# Patient Record
Sex: Female | Born: 1985 | Race: Black or African American | Hispanic: No | Marital: Married | Smoking: Never smoker
Health system: Southern US, Community
[De-identification: ages and names within clinical notes are randomized; demographics above are authoritative.]

## PROBLEM LIST (undated history)

## (undated) ENCOUNTER — Inpatient Hospital Stay (HOSPITAL_COMMUNITY): Payer: Self-pay

## (undated) DIAGNOSIS — Z789 Other specified health status: Secondary | ICD-10-CM

## (undated) DIAGNOSIS — Z3490 Encounter for supervision of normal pregnancy, unspecified, unspecified trimester: Secondary | ICD-10-CM

## (undated) HISTORY — PX: NO PAST SURGERIES: SHX2092

---

## 2011-10-14 NOTE — L&D Delivery Note (Signed)
Delivery Note At 4:02 PM a viable female, "Kelli Griffin" was delivered via Vaginal, Spontaneous Delivery (Presentation: LOP;  ).  APGAR: 8, 9; weight 6 lb 7 oz (2920 g).   Placenta status: Intact, Spontaneous.  Cord: 3 vessels with the following complications: None.  Cord pH: TBA Prolonged crowning was noted, therefore midline episiotomy was performed.  Infant was floppy at delivery, with presentation LOP.  He responded to stimulation and suctioning, and was able to be placed skin to skin with mother.  Patient's temp was 99 axillary just before delivery, then 101.3 axillary just after delivery.  Per consult with Dr. Pennie Rushing, plan observation of temp pp--no additional orders at present. Perineum was edematous prior to delivery, remains so after--no evidence of hematoma.  Anesthesia: Epidural  Episiotomy: Median Lacerations: None Suture Repair: 3.0 and 4.0 monocryl Est. Blood Loss (mL): 300 cc  Mom to postpartum.  Baby to skin to skin. FOB left just after delivery--plans to return tonight.  No information yet on circumcision plan.  Nigel Bridgeman 07/26/2012, 4:56 PM

## 2011-12-03 ENCOUNTER — Encounter (HOSPITAL_COMMUNITY): Payer: Self-pay

## 2011-12-03 ENCOUNTER — Emergency Department (HOSPITAL_COMMUNITY)
Admission: EM | Admit: 2011-12-03 | Discharge: 2011-12-03 | Disposition: A | Discharge status: Home / Self Care | Payer: Self-pay | Attending: Emergency Medicine | Admitting: Emergency Medicine

## 2011-12-03 DIAGNOSIS — O210 Mild hyperemesis gravidarum: Secondary | ICD-10-CM | POA: Insufficient documentation

## 2011-12-03 DIAGNOSIS — R630 Anorexia: Secondary | ICD-10-CM | POA: Insufficient documentation

## 2011-12-03 LAB — URINALYSIS, ROUTINE W REFLEX MICROSCOPIC
Bilirubin Urine: NEGATIVE
Ketones, ur: 15 mg/dL — AB
Leukocytes, UA: NEGATIVE
Nitrite: NEGATIVE
Specific Gravity, Urine: 1.025 (ref 1.005–1.030)
Urobilinogen, UA: 1 mg/dL (ref 0.0–1.0)
pH: 6 (ref 5.0–8.0)

## 2011-12-03 LAB — URINE MICROSCOPIC-ADD ON

## 2011-12-03 MED ORDER — SODIUM CHLORIDE 0.9 % IV BOLUS (SEPSIS)
1000.0000 mL | Freq: Once | INTRAVENOUS | Status: AC
  Administered 2011-12-03: 1000 mL via INTRAVENOUS

## 2011-12-03 MED ORDER — PRENATAL RX 60-1 MG PO TABS: 1.0000 | ORAL_TABLET | Freq: Every day | ORAL | Status: DC

## 2011-12-03 MED ORDER — ONDANSETRON HCL 4 MG PO TABS: 4.0000 mg | ORAL_TABLET | Freq: Four times a day (QID) | ORAL | Status: AC

## 2011-12-03 MED ORDER — ONDANSETRON HCL 4 MG/2ML IJ SOLN
4.0000 mg | Freq: Once | INTRAMUSCULAR | Status: AC
  Administered 2011-12-03: 4 mg via INTRAVENOUS
  Filled 2011-12-03: qty 2

## 2011-12-03 NOTE — Discharge Instructions (Signed)
Return to the ED with any concerns including vomiting and not able to keep down liquids, fever, abdominal pain, vaginal bleeding, fainting, decreased level of alertness or lethargy, or any other alarming symptoms.

## 2011-12-03 NOTE — ED Notes (Signed)
Pt presents with 2 day h/o vomiting.  Pt is pregnant, denies any abdominal pain or diarrhea.  Pt reports nausea, decreased appetite.

## 2011-12-03 NOTE — ED Provider Notes (Signed)
History     CSN: [id number]  Arrival date & time 12/03/11  1324   First MD Initiated Contact with Patient 12/03/11 1617      Chief Complaint  Patient presents with  . Emesis    (Consider location/radiation/quality/duration/timing/severity/associated sxs/prior treatment) HPI Patient presents with complaint of nausea and vomiting. She states that she vomited 3 times today. She has been able to keep down fluids but has not been able to eat solid food. She does state that she is pregnant. She has not established primary care as of yet. She denies any abdominal pain, vaginal bleeding or vaginal discharge. She's had no fainting. She denies fever. She denies dysuria urgency or frequency.  There are no other associated systemic symptoms, there are no other alleviating or modifying factors.   History reviewed. No pertinent past medical history.  History reviewed. No pertinent past surgical history.  No family history on file.  History  Substance Use Topics  . Smoking status: Never Smoker   . Smokeless tobacco: Not on file  . Alcohol Use:     OB History    Grav Para Term Preterm Abortions TAB SAB Ect Mult Living   1               Review of Systems ROS reviewed and otherwise negative except for mentioned in HPI  Allergies  Review of patient's allergies indicates no known allergies.  Home Medications   Current Outpatient Rx  Name Route Sig Dispense Refill  . ONDANSETRON HCL 4 MG PO TABS Oral Take 1 tablet (4 mg total) by mouth every 6 (six) hours. 12 tablet 0  . PRENATAL RX 60-1 MG PO TABS Oral Take 1 tablet by mouth daily. 1 tablet 0    BP 107/70  Pulse 81  Temp(Src) 98.6 F (37 C) (Oral)  Resp 16  Ht 5\' 3"  (1.6 m)  Wt 110 lb (49.896 kg)  BMI 19.49 kg/m2  SpO2 100%  LMP 11/26/2011 Vitals reviewed Physical Exam Physical Examination: General appearance - alert, well appearing, and in no distress Mental status - alert, oriented to person, place, and time Eyes-  PERRL, no scleral icterus Mouth - mucous membranes moist, pharynx normal without lesions Chest - clear to auscultation, no wheezes, rales or rhonchi, symmetric air entry Heart - normal rate, regular rhythm, normal S1, S2, no murmurs, rubs, clicks or gallops Abdomen - soft, nontender, nondistended, no masses or organomegaly, NABS Extremities - peripheral pulses normal, no pedal edema, no clubbing or cyanosis Skin - normal coloration and turgor, no rashes  ED Course  Procedures (including critical care time)  Labs Reviewed  URINALYSIS, ROUTINE W REFLEX MICROSCOPIC - Abnormal; Notable for the following:    Hgb urine dipstick TRACE (*)    Ketones, ur 15 (*)    All other components within normal limits  POCT PREGNANCY, URINE - Abnormal; Notable for the following:    Preg Test, Ur POSITIVE (*)    All other components within normal limits  URINE MICROSCOPIC-ADD ON - Abnormal; Notable for the following:    Squamous Epithelial / LPF MANY (*)    All other components within normal limits  LAB REPORT - SCANNED   No results found.   1. Hyperemesis gravidarum       MDM  Patient presenting in early pregnancy with nausea and vomiting. Her abdomen is nontender. She denies any vaginal bleeding or vaginal discharge. She feels improved after IV hydration as well as antibiotics. Her urinalysis was reassuring. She is  nontoxic and well-hydrated in appearance. She and her husband are requesting discharge at this time. She will be given OB referral information and started on prenatal vitamins as well as discharge with prescription for antibiotics. She was given strict return precautions and is agreeable with this plan.        Ethelda Chick, MD 12/06/11 [phone]

## 2011-12-18 ENCOUNTER — Inpatient Hospital Stay (HOSPITAL_COMMUNITY)
Admission: AD | Admit: 2011-12-18 | Discharge: 2011-12-18 | Disposition: A | Discharge status: Home / Self Care | Payer: Self-pay | Source: Ambulatory Visit | Attending: Obstetrics & Gynecology | Admitting: Obstetrics & Gynecology

## 2011-12-18 ENCOUNTER — Encounter (HOSPITAL_COMMUNITY): Payer: Self-pay

## 2011-12-18 DIAGNOSIS — O210 Mild hyperemesis gravidarum: Secondary | ICD-10-CM | POA: Insufficient documentation

## 2011-12-18 DIAGNOSIS — O219 Vomiting of pregnancy, unspecified: Secondary | ICD-10-CM

## 2011-12-18 HISTORY — DX: Other specified health status: Z78.9

## 2011-12-18 LAB — COMPREHENSIVE METABOLIC PANEL
ALT: 8 U/L (ref 0–35)
AST: 15 U/L (ref 0–37)
Alkaline Phosphatase: 34 U/L — ABNORMAL LOW (ref 39–117)
CO2: 24 mEq/L (ref 19–32)
Chloride: 99 mEq/L (ref 96–112)
GFR calc Af Amer: >90 mL/min (ref >90)
GFR calc non Af Amer: >90 mL/min (ref >90)
Glucose, Bld: 96 mg/dL (ref 70–99)
Sodium: 130 mEq/L — ABNORMAL LOW (ref 135–145)
Total Bilirubin: 0.2 mg/dL — ABNORMAL LOW (ref 0.3–1.2)

## 2011-12-18 LAB — URINALYSIS, ROUTINE W REFLEX MICROSCOPIC
Glucose, UA: NEGATIVE mg/dL
Hgb urine dipstick: NEGATIVE
Leukocytes, UA: NEGATIVE
pH: 6.5 (ref 5.0–8.0)

## 2011-12-18 LAB — CBC
MCH: 21.2 pg — ABNORMAL LOW (ref 26.0–34.0)
MCHC: 31.4 g/dL (ref 30.0–36.0)
Platelets: 262 10*3/uL (ref 150–400)
RDW: 14.9 % (ref 11.5–15.5)

## 2011-12-18 MED ORDER — PROMETHAZINE HCL 25 MG PO TABS: 12.5000 mg | ORAL_TABLET | Freq: Four times a day (QID) | ORAL | Status: DC | PRN

## 2011-12-18 MED ORDER — PROMETHAZINE HCL 12.5 MG PO TABS: 12.5000 mg | ORAL_TABLET | Freq: Four times a day (QID) | ORAL | Status: DC | PRN

## 2011-12-18 MED ORDER — FAMOTIDINE 20 MG PO TABS
20.0000 mg | ORAL_TABLET | ORAL | Status: AC
  Administered 2011-12-18: 20 mg via ORAL
  Filled 2011-12-18: qty 1

## 2011-12-18 MED ORDER — LACTATED RINGERS IV BOLUS (SEPSIS): 1000.0000 mL | Freq: Once | INTRAVENOUS | Status: DC

## 2011-12-18 MED ORDER — PROMETHAZINE HCL 25 MG PO TABS
12.5000 mg | ORAL_TABLET | ORAL | Status: AC
  Administered 2011-12-18: 12.5 mg via ORAL
  Filled 2011-12-18: qty 1

## 2011-12-18 MED ORDER — LACTATED RINGERS IV BOLUS (SEPSIS)
1000.0000 mL | INTRAVENOUS | Status: AC
  Administered 2011-12-18: 1000 mL via INTRAVENOUS

## 2011-12-18 NOTE — Progress Notes (Signed)
Patient states she has had vomiting since she was seen at [facility] ED on 2-20. Has no appetite and upper abdominal pain with vomiting. No bleeding or discharge.

## 2011-12-18 NOTE — ED Provider Notes (Signed)
History     Chief Complaint  Patient presents with  . Emesis During Pregnancy   HPI Pt presents with vomiting x3 today.  She reports she has not been able to keep food down.  She has not taken the medication prescribed for nausea by the ED at [facility].  She reports abdominal pain indicated as epigastric, and denies lower abdominal pain, cramping, vaginal bleeding, dizziness, or fever/chills.    Patient's last menstrual period was 11/26/2011.   OB History    Grav Para Term Preterm Abortions TAB SAB Ect Mult Living   1 0 0 0 0 0 0 0 0 0       Past Medical History  Diagnosis Date  . No pertinent past medical history     Past Surgical History  Procedure Date  . No past surgeries     No family history on file.  History  Substance Use Topics  . Smoking status: Never Smoker   . Smokeless tobacco: Not on file  . Alcohol Use:     Allergies: No Known Allergies  Prescriptions prior to admission  Medication Sig Dispense Refill  . Prenatal Vit-Fe Fumarate-FA (PRENATAL MULTIVITAMIN) TABS Take 1 tablet by mouth daily.        Review of Systems  Constitutional: Negative for fever, chills and malaise/fatigue.  Eyes: Negative for blurred vision.  Respiratory: Negative for cough and shortness of breath.   Cardiovascular: Negative for chest pain.  Gastrointestinal: Positive for heartburn, nausea and vomiting. Negative for diarrhea and constipation.  Genitourinary: Negative for dysuria, urgency and frequency.  Musculoskeletal: Negative.   Neurological: Negative for dizziness and headaches.  Psychiatric/Behavioral: Negative for depression.   Physical Exam   Blood pressure 109/70, pulse 91, temperature 98.7 F (37.1 C), temperature source Oral, resp. rate 16, height 5' 0.75" (1.543 m), weight 52.799 kg (116 lb 6.4 oz), last menstrual period 11/26/2011, SpO2 100.00%.  Physical Exam  Nursing note and vitals reviewed. Constitutional: She is oriented to person, place, and time. She  appears well-developed and well-nourished.  Neck: Normal range of motion.  Cardiovascular: Normal rate.   Respiratory: Effort normal.  GI: Soft. There is no tenderness.  Musculoskeletal: Normal range of motion.  Neurological: She is alert and oriented to person, place, and time.  Skin: Skin is warm and dry.  Psychiatric: She has a normal mood and affect. Her behavior is normal. Judgment and thought content normal.   Results for orders placed during the hospital encounter of 12/18/11 (from the past 24 hour(s))  URINALYSIS, ROUTINE W REFLEX MICROSCOPIC     Status: Abnormal   Collection Time   12/18/11  1:30 PM      Component Value Range   Color, Urine YELLOW  YELLOW    APPearance HAZY (*) CLEAR    Specific Gravity, Urine 1.020  1.005 - 1.030    pH 6.5  5.0 - 8.0    Glucose, UA NEGATIVE  NEGATIVE (mg/dL)   Hgb urine dipstick NEGATIVE  NEGATIVE    Bilirubin Urine NEGATIVE  NEGATIVE    Ketones, ur >80 (*) NEGATIVE (mg/dL)   Protein, ur NEGATIVE  NEGATIVE (mg/dL)   Urobilinogen, UA 0.2  0.0 - 1.0 (mg/dL)   Nitrite NEGATIVE  NEGATIVE    Leukocytes, UA NEGATIVE  NEGATIVE   COMPREHENSIVE METABOLIC PANEL     Status: Abnormal   Collection Time   12/18/11  2:10 PM      Component Value Range   Sodium 130 (*) 135 - 145 (mEq/L)  Potassium 3.5  3.5 - 5.1 (mEq/L)   Chloride 99  96 - 112 (mEq/L)   CO2 24  19 - 32 (mEq/L)   Glucose, Bld 96  70 - 99 (mg/dL)   BUN 7  6 - 23 (mg/dL)   Creatinine, Ser 4.09  0.50 - 1.10 (mg/dL)   Calcium 81.1  8.4 - 10.5 (mg/dL)   Total Protein 7.4  6.0 - 8.3 (g/dL)   Albumin 3.7  3.5 - 5.2 (g/dL)   AST 15  0 - 37 (U/L)   ALT 8  0 - 35 (U/L)   Alkaline Phosphatase 34 (*) 39 - 117 (U/L)   Total Bilirubin 0.2 (*) 0.3 - 1.2 (mg/dL)   GFR calc non Af Amer >90  >90 (mL/min)   GFR calc Af Amer >90  >90 (mL/min)  CBC     Status: Abnormal   Collection Time   12/18/11  2:10 PM      Component Value Range   WBC 7.1  4.0 - 10.5 (K/uL)   RBC 4.95  3.87 - 5.11 (MIL/uL)    Hemoglobin 10.5 (*) 12.0 - 15.0 (g/dL)   HCT 91.4 (*) 78.2 - 46.0 (%)   MCV 67.5 (*) 78.0 - 100.0 (fL)   MCH 21.2 (*) 26.0 - 34.0 (pg)   MCHC 31.4  30.0 - 36.0 (g/dL)   RDW 95.6  21.3 - 08.6 (%)   Platelets 262  150 - 400 (K/uL)   MAU Course  Procedures U/a, CBC, CMP  MDM Phenergan and Pepcid PO given in MAU, IV LR  Assessment and Plan  A: N/V of pregnancy  P: D/C home Return to MAU as needed if worsening n/v, vaginal bleeding, lower abdominal pain, fever/chills  Medication List  As of 12/18/2011  8:50 PM   TAKE these medications         prenatal multivitamin Tabs   Take 1 tablet by mouth daily.      promethazine 12.5 MG tablet   Commonly known as: PHENERGAN   Take 1 tablet (12.5 mg total) by mouth every 6 (six) hours as needed for nausea.           LEFTWICH-KIRBY, Keirston Saephanh 12/18/2011, 2:41 PM

## 2011-12-18 NOTE — Discharge Instructions (Signed)
Morning Sickness Morning sickness is when you feel sick to your stomach (nauseous) during pregnancy. This nauseous feeling may or may not come with throwing up (vomiting). It often occurs in the morning, but can be a problem any time of day. While morning sickness is unpleasant, it is usually harmless unless you develop severe and continual vomiting (hyperemesis gravidarum). This condition requires more intense treatment. CAUSES  The cause of morning sickness is not completely known but seems to be related to a sudden increase of two hormones:   Human chorionic gonadotropin (hCG).   Estrogen hormone.  These are elevated in the first part of the pregnancy. TREATMENT  Do not use any medicines (prescription, over-the-counter, or herbal) for morning sickness without first talking to your caregiver. Some patients are helped by the following:  Vitamin B6 (25mg  every 8 hours) or vitamin B6 shots.   An antihistamine called doxylamine (10mg  every 8 hours).   The herbal medication ginger.  HOME CARE INSTRUCTIONS   Taking multivitamins before getting pregnant can prevent or decrease the severity of morning sickness in most women.   Eat a piece of dry toast or unsalted crackers before getting out of bed in the morning.   Eat 5 or 6 small meals a day.   Eat dry and bland foods (rice, baked potato).   Do not drink liquids with your meals. Drink liquids between meals.   Avoid greasy, fatty, and spicy foods.   Get someone to cook for you if the smell of any food causes nausea and vomiting.   Avoid vitamin pills with iron because iron can cause nausea.   Snack on protein foods between meals if you are hungry.   Eat unsweetened gelatins for deserts.   Wear an acupressure wristband (worn for sea sickness) may be helpful.   Acupuncture may be helpful.   Do not smoke.   Get a humidifier to keep the air in your house free of odors.  SEEK MEDICAL CARE IF:   Your home remedies are not working  and you need medication.   You feel dizzy or lightheaded.   You are losing weight.   You need help with your diet.  SEEK IMMEDIATE MEDICAL CARE IF:   You have persistent and uncontrolled nausea and vomiting.   You pass out (faint).   You have a fever.  MAKE SURE YOU:   Understand these instructions.   Will watch your condition.   Will get help right away if you are not doing well or get worse.  Document Released: 11/20/2006 Document Revised: 09/18/2011 Document Reviewed: 09/17/2007 [facility] Patient Information 2012 [place], [place].  Pregnancy, First Trimester The first trimester is the first 3 months your baby is growing inside you. It is important to follow your doctor's instructions. HOME CARE   Do not smoke.   Do not drink alcohol.   Only take medicine as told by your doctor.   Exercise.   Eat healthy foods. Eat regular, well-balanced meals.   You can have sex (intercourse) if there are no other problems with the pregnancy.   Things that help with morning sickness:   Eat soda crackers before getting up in the morning.   Eat 4 to 5 small meals rather than 3 large meals.   Drink liquids between meals, not during meals.   Go to all appointments as told.   Take all vitamins or supplements as told by your doctor.  GET HELP RIGHT AWAY IF:   You develop a fever.  You have a bad smelling fluid that is leaking from your vagina.   There is bleeding from the vagina.   You develop severe belly (abdominal) or back pain.   You throw up (vomit) blood. It may look like coffee grounds.   You lose more than 2 pounds in a week.   You gain 5 pounds or more in a week.   You gain more than 2 pounds in a week and you see puffiness (swelling) in your feet, ankles, or legs.   You have severe dizziness or pass out (faint).   You are around people who have [place] measles, chickenpox, or fifth disease.   You have a headache, watery poop (diarrhea), pain with peeing  (urinating), or cannot breath right.  Document Released: 03/17/2008 Document Revised: 09/18/2011 Document Reviewed: 03/17/2008 [facility] Patient Information 2012 [place], [place].

## 2012-02-21 ENCOUNTER — Encounter (HOSPITAL_COMMUNITY): Payer: Self-pay | Admitting: *Deleted

## 2012-02-21 ENCOUNTER — Emergency Department (HOSPITAL_COMMUNITY)
Admission: EM | Admit: 2012-02-21 | Discharge: 2012-02-22 | Disposition: A | Discharge status: Home / Self Care | Payer: Medicaid Other | Attending: Emergency Medicine | Admitting: Emergency Medicine

## 2012-02-21 DIAGNOSIS — O99891 Other specified diseases and conditions complicating pregnancy: Secondary | ICD-10-CM | POA: Insufficient documentation

## 2012-02-21 DIAGNOSIS — R51 Headache: Secondary | ICD-10-CM | POA: Insufficient documentation

## 2012-02-21 DIAGNOSIS — O26899 Other specified pregnancy related conditions, unspecified trimester: Secondary | ICD-10-CM

## 2012-02-21 DIAGNOSIS — R112 Nausea with vomiting, unspecified: Secondary | ICD-10-CM | POA: Insufficient documentation

## 2012-02-21 HISTORY — DX: Encounter for supervision of normal pregnancy, unspecified, unspecified trimester: Z34.90

## 2012-02-21 LAB — URINALYSIS, ROUTINE W REFLEX MICROSCOPIC
Glucose, UA: NEGATIVE mg/dL
Ketones, ur: NEGATIVE mg/dL
Nitrite: NEGATIVE
Protein, ur: NEGATIVE mg/dL
Urobilinogen, UA: 0.2 mg/dL (ref 0.0–1.0)

## 2012-02-21 LAB — URINE MICROSCOPIC-ADD ON

## 2012-02-21 MED ORDER — OXYCODONE-ACETAMINOPHEN 5-325 MG PO TABS
1.0000 | ORAL_TABLET | Freq: Once | ORAL | Status: AC
  Administered 2012-02-21: 1 via ORAL
  Filled 2012-02-21: qty 1

## 2012-02-21 NOTE — ED Provider Notes (Signed)
History     CSN: [id number]  Arrival date & time 02/21/12  2028   First MD Initiated Contact with Patient 02/21/12 2138      Chief Complaint  Patient presents with  . Headache    (Consider location/radiation/quality/duration/timing/severity/associated sxs/prior treatment) HPI History provided by pt and significant other who is translating.  Per significant other, pt is 4 months pregnant.  Has not received any prenatal care because uninsured.  She has been experiencing an intermittent headache since yesterday morning.  Pt reports that pain located across forehead.  No associated fever, dizziness, blurred vision.  Has N/V but this is chronic since she became pregnant.  Denies nasal congestion, rhinorrhea and sore throat.  Has never had a headache like this before.  Denies head trauma.  Denies abd pain, vaginal bleeding and vaginal discharge.  Past Medical History  Diagnosis Date  . No pertinent past medical history   . Pregnant     Past Surgical History  Procedure Date  . No past surgeries     History reviewed. No pertinent family history.  History  Substance Use Topics  . Smoking status: Never Smoker   . Smokeless tobacco: Not on file  . Alcohol Use: Yes    OB History    Grav Para Term Preterm Abortions TAB SAB Ect Mult Living   1 0 0 0 0 0 0 0 0 0       Review of Systems  All other systems reviewed and are negative.    Allergies  Review of patient's allergies indicates no known allergies.  Home Medications   Current Outpatient Rx  Name Route Sig Dispense Refill  . PRENATAL MULTIVITAMIN CH Oral Take 1 tablet by mouth daily.      BP 108/68  Pulse 90  Temp(Src) 99.4 F (37.4 C) (Oral)  Resp 16  SpO2 100%  LMP 11/26/2011  Physical Exam  Nursing note and vitals reviewed. Constitutional: She is oriented to person, place, and time. She appears well-developed and well-nourished. No distress.       Pt does not appear uncomfortable.  HENT:  Head:  Normocephalic and atraumatic.       No sinus ttp  Eyes:       Normal appearance  Neck: Normal range of motion.       No meningeal signs  Cardiovascular: Normal rate, regular rhythm and intact distal pulses.   Pulmonary/Chest: Effort normal and breath sounds normal.  Musculoskeletal: Normal range of motion.  Neurological: She is alert and oriented to person, place, and time. No sensory deficit. Coordination normal.       CN 3-12 intact.  No nystagmus. 5/5 and equal upper and lower extremity strength.  No past pointing.     Skin: Skin is warm and dry. No rash noted.  Psychiatric: She has a normal mood and affect. Her behavior is normal.    ED Course  Procedures (including critical care time)  Labs Reviewed  URINALYSIS, ROUTINE W REFLEX MICROSCOPIC - Abnormal; Notable for the following:    Leukocytes, UA SMALL (*)    All other components within normal limits  POCT PREGNANCY, URINE - Abnormal; Notable for the following:    Preg Test, Ur POSITIVE (*)    All other components within normal limits  URINE MICROSCOPIC-ADD ON - Abnormal; Notable for the following:    Squamous Epithelial / LPF MANY (*)    Bacteria, UA FEW (*)    All other components within normal limits   No results found.  1. Headache in pregnancy, antepartum       MDM  Healthy 26yo F who is approx 4 months pregnant, no prenatal care, presents w/ non-traumatic frontal headache.  On exam, pt well-appearing, afebrile, no focal neuro deficits or meningeal signs.  No HTN or proteinuria to suggest preeclampsia.  Pt received one po percocet and headache resolved.  Pt d/c'd home.  Recommended tylenol for pain and f/u with [facility] to initiate prenatal care asap.  Return precautions discussed.          Otilio Miu, [place] 02/22/12 1223

## 2012-02-21 NOTE — ED Notes (Addendum)
Pt given ice pack for pain and encouraged to give urine.

## 2012-02-21 NOTE — ED Notes (Signed)
Patient with headache that started yesterday.  States that it seem to happen at night and tonight her headache is worse.  Patient has had headaches in the past but not like today.  Patient is 4 months pregnant

## 2012-02-21 NOTE — ED Notes (Signed)
Pt c/o headache since yesterday.  Denies hx of migraines.  Denies photophobia or changes in vision.  Pt is 4 months pregnant.

## 2012-02-22 NOTE — ED Provider Notes (Signed)
Medical screening examination/treatment/procedure(s) were performed by non-physician practitioner and as supervising physician I was immediately available for consultation/collaboration.   Carleene Cooper III, MD 02/22/12 [phone]

## 2012-02-22 NOTE — Discharge Instructions (Signed)
Take tylenol as needed for headache pain.  Avoid medications like ibuprofen, advil, motrin and aleve because these could be harmful to the baby.  Continue your prenatal vitamin.  Follow up with [facility] [place] [phone]; [address]) as soon as possible.  You should return to the ER if your headache pain worsens or you develop associated fever.

## 2012-04-09 ENCOUNTER — Inpatient Hospital Stay (HOSPITAL_COMMUNITY)
Admission: AD | Admit: 2012-04-09 | Discharge: 2012-04-09 | Disposition: A | Discharge status: Home / Self Care | Payer: Medicaid Other | Source: Ambulatory Visit | Attending: Obstetrics & Gynecology | Admitting: Obstetrics & Gynecology

## 2012-04-09 ENCOUNTER — Encounter (HOSPITAL_COMMUNITY): Payer: Self-pay

## 2012-04-09 DIAGNOSIS — O0930 Supervision of pregnancy with insufficient antenatal care, unspecified trimester: Secondary | ICD-10-CM | POA: Insufficient documentation

## 2012-04-09 DIAGNOSIS — O210 Mild hyperemesis gravidarum: Secondary | ICD-10-CM | POA: Insufficient documentation

## 2012-04-09 NOTE — Discharge Instructions (Signed)
Prenatal Care Providers °[facility]    [facility] ° Phone- [phone]     Phone: [phone] °         °[facility]                      [facility] ° @[place]     Phone: [phone] ° Phone: [phone] °        [facility] °[facility]     Phone: [phone] ° Phone: [phone]   °        [facility] °[facility] [place]                hone: [phone] ° Phone: [phone] °        [facility] °Dr. Bernard Marshall      Phone: [phone] ° Phone: [phone] °        [facility] °[facility]                Phone: [phone] ° Women’s Health  ° Phone:[phone]    [facility] °         Phone: [phone] °[facility] °  Phone: [phone] °

## 2012-04-09 NOTE — MAU Provider Note (Signed)
Chief Complaint:  No chief complaint on file.   First Provider Initiated Contact with Patient 04/09/12 1002      HPI  Kelli Griffin is a 26 y.o. G1P0000 at [redacted]w[redacted]d presents wanting to start prenatal care.  Pregnancy problems other than visits in the MA U. For nausea and vomiting of pregnancy which has resolved. Denies contractions, leakage of fluid or vaginal bleeding. Good fetal movement.  Recently moved here from [place]. Has Medicaid.  Pregnancy Course: uncomplicated, NPC  Past Medical History: Past Medical History  Diagnosis Date  . No pertinent past medical history   . Pregnant     Past Surgical History: Past Surgical History  Procedure Date  . No past surgeries     Family History: Family History  Problem Relation Age of Onset  . Other Neg Hx     Social History: History  Substance Use Topics  . Smoking status: Never Smoker   . Smokeless tobacco: Not on file  . Alcohol Use: Yes    Allergies: No Known Allergies  Meds:  Prescriptions prior to admission  Medication Sig Dispense Refill  . Prenatal Vit-Fe Fumarate-FA (PRENATAL MULTIVITAMIN) TABS Take 1 tablet by mouth daily.          Physical Exam  Blood pressure 100/65, pulse 85, temperature 98.6 F (37 C), temperature source Oral, resp. rate 16, height 5\' 1"  (1.549 m), weight 57.516 kg (126 lb 12.8 oz), last menstrual period 11/26/2011, SpO2 100.00%. GENERAL: Well-developed, well-nourished female in no acute distress.  HEENT: normocephalic, good dentition HEART: normal rate RESP: normal effort ABDOMEN: Soft, nontender, gravid appropriate for gestational age FH=21 cm; FHR 152 EXTREMITIES: Nontender, no edema NEURO: alert and oriented    Assessment:  G1 @ [redacted]w[redacted]d Normal first pregnnacy Insufficient prenatal care  Plan:  Continue prenatal vitamins, pregnancy precautions, list of providers reviewed Advices our care since possible with provider of choice   Kailo Kosik 6/28/201310:03 AM

## 2012-04-09 NOTE — MAU Note (Signed)
Patient has had no prenatal care since coming to MAU in early pregnancy for nausea and vomiting. States she is not having any problems but wants to have the baby checked. Denies any pain, bleeding or leaking and has felt some movement.

## 2012-04-13 NOTE — MAU Provider Note (Signed)
Medical Screening exam and patient care preformed by advanced practice provider.  Agree with the above management.  

## 2012-04-30 ENCOUNTER — Ambulatory Visit (INDEPENDENT_AMBULATORY_CARE_PROVIDER_SITE_OTHER): Payer: Medicaid Other

## 2012-04-30 ENCOUNTER — Encounter: Payer: Self-pay | Admitting: Obstetrics and Gynecology

## 2012-04-30 ENCOUNTER — Ambulatory Visit (INDEPENDENT_AMBULATORY_CARE_PROVIDER_SITE_OTHER): Payer: Medicaid Other | Admitting: Obstetrics and Gynecology

## 2012-04-30 ENCOUNTER — Ambulatory Visit: Payer: Medicaid Other

## 2012-04-30 VITALS — BP 100/62 | Wt 130.0 lb

## 2012-04-30 DIAGNOSIS — Z3689 Encounter for other specified antenatal screening: Secondary | ICD-10-CM

## 2012-04-30 DIAGNOSIS — Z331 Pregnant state, incidental: Secondary | ICD-10-CM

## 2012-04-30 DIAGNOSIS — O0930 Supervision of pregnancy with insufficient antenatal care, unspecified trimester: Secondary | ICD-10-CM

## 2012-04-30 LAB — US OB COMP + 14 WK

## 2012-04-30 LAB — POCT URINALYSIS DIPSTICK
Glucose, UA: NEGATIVE
Ketones, UA: NEGATIVE
Spec Grav, UA: 1.015
Urobilinogen, UA: NEGATIVE

## 2012-04-30 NOTE — Progress Notes (Signed)
[redacted]w[redacted]d USS to day f/u USS anatomy LMP stated: 11/26/11 which gives GA [redacted]w[redacted]d USS gives date of GA [redacted]w[redacted]d  Possible need to amend dating as date variance over 5 weeks. Anatomy USS: Vx presentation placenta posterior Fluid normal, Anatomy normal, Normal ovaries, no fluidin CDS, Normal [place] ROB in 4 weeks

## 2012-04-30 NOTE — Progress Notes (Unsigned)
UNKNOWN PREGRAVID WT.  PER DD PT TO HAVE ANATOMY U/S TODAY AND SHE WILL REVIEW.

## 2012-04-30 NOTE — Progress Notes (Signed)
No complaints per pt  NOB scheduled 05/14/12 with [facility]

## 2012-05-01 LAB — PRENATAL PANEL VII
Eosinophils Absolute: 0.5 10*3/uL (ref 0.0–0.7)
HCT: 30.4 % — ABNORMAL LOW (ref 36.0–46.0)
HIV: NONREACTIVE
Hemoglobin: 9.8 g/dL — ABNORMAL LOW (ref 12.0–15.0)
Hepatitis B Surface Ag: NEGATIVE
Lymphs Abs: 2.4 10*3/uL (ref 0.7–4.0)
MCH: 22.5 pg — ABNORMAL LOW (ref 26.0–34.0)
Monocytes Relative: 5 % (ref 3–12)
Neutro Abs: 5.1 10*3/uL (ref 1.7–7.7)
Neutrophils Relative %: 61 % (ref 43–77)
RBC: 4.36 MIL/uL (ref 3.87–5.11)
Rubella: 102.7 IU/mL — ABNORMAL HIGH

## 2012-05-02 LAB — CULTURE, OB URINE: Colony Count: NO GROWTH

## 2012-05-03 ENCOUNTER — Telehealth: Payer: Self-pay | Admitting: Obstetrics and Gynecology

## 2012-05-03 ENCOUNTER — Encounter: Payer: Self-pay | Admitting: Obstetrics and Gynecology

## 2012-05-03 LAB — VARICELLA ZOSTER ANTIBODY, IGG: Varicella IgG: 2.79 {ISR} — ABNORMAL HIGH

## 2012-05-03 NOTE — Telephone Encounter (Signed)
Message copied by Mason Jim on Mon May 03, 2012  9:27 AM ------      Message from: [facility]      Created: Sat May 01, 2012 12:15 PM       Needs iron 325 mg po daily #30 refill x 5      pnv daily discuss diet for anemia

## 2012-05-03 NOTE — Telephone Encounter (Signed)
TC to pt.  Spoke with husband.   Per [facility] informed to take Fe 325 mg/day and to continue PNV.  Also discussed iron rich diet.  Husband verbalized comprehension.

## 2012-05-04 LAB — VARICELLA ZOSTER ANTIBODY, IGM: Varicella Zoster Ab IgM: 0.04 (ref <0.91)

## 2012-05-04 LAB — AFP, QUAD SCREEN
Curr Gest Age: 22.2 wks.days
MoM for AFP: 2.65
Trisomy 18 (Edward) Syndrome Interp.: 1:98600 {titer}
uE3 Value: 4.9 ng/mL

## 2012-05-04 LAB — HEMOGLOBINOPATHY EVALUATION: Hemoglobin Other: 0 %

## 2012-05-07 ENCOUNTER — Telehealth: Payer: Self-pay | Admitting: Obstetrics and Gynecology

## 2012-05-07 NOTE — Telephone Encounter (Signed)
TC to pt's husband.   Has already been instructed and is taking Fe.

## 2012-05-11 ENCOUNTER — Encounter (HOSPITAL_COMMUNITY): Payer: Self-pay | Admitting: *Deleted

## 2012-05-11 ENCOUNTER — Inpatient Hospital Stay (HOSPITAL_COMMUNITY)
Admission: AD | Admit: 2012-05-11 | Discharge: 2012-05-11 | Disposition: A | Discharge status: Home / Self Care | Payer: Medicaid Other | Source: Ambulatory Visit | Attending: Obstetrics and Gynecology | Admitting: Obstetrics and Gynecology

## 2012-05-11 DIAGNOSIS — N644 Mastodynia: Secondary | ICD-10-CM

## 2012-05-11 DIAGNOSIS — O99891 Other specified diseases and conditions complicating pregnancy: Secondary | ICD-10-CM | POA: Insufficient documentation

## 2012-05-11 DIAGNOSIS — Z789 Other specified health status: Secondary | ICD-10-CM

## 2012-05-11 DIAGNOSIS — Z331 Pregnant state, incidental: Secondary | ICD-10-CM

## 2012-05-11 DIAGNOSIS — O0930 Supervision of pregnancy with insufficient antenatal care, unspecified trimester: Secondary | ICD-10-CM | POA: Insufficient documentation

## 2012-05-11 HISTORY — DX: Other specified health status: Z78.9

## 2012-05-11 MED ORDER — IBUPROFEN 200 MG PO TABS: 600.0000 mg | ORAL_TABLET | Freq: Four times a day (QID) | ORAL | Status: AC | PRN

## 2012-05-11 NOTE — MAU Note (Signed)
WITH INTERPRETER-ON PHONE-  FRENCH-  [phone]-    PT SAYS SHE IS FEELING THROBBING PAIN INSIDE L BREAST.  NO PAIN ON R BREAST.    THIS PAIN STARTED  AT 10AM.   NO DRAINAGE FROM NIPPLES.   NO MEDS FOR PAIN.       GETS [facility] AT [facility]- DID NOT CALL THEM TONIGHT .  LAST SEEN IN OFFICE -  8-23.  NEXT APPOINTMENT-   IS 8-2. DENIES INJURY TO BREAST.  IN TRIAGE - PT IS HOLDING BREAST.

## 2012-05-11 NOTE — MAU Provider Note (Signed)
  History   Kelli Griffin is a 25y.o. African female who presents unannounced at [redacted]w[redacted]d with CC of Lt breast pain since around 1000 this morning.  No prior hx of breast pain.  Describes pain as "throbbing" and at times, "stinging."  No d/c or Leakage from nipple.  No bumps, masses, lesions or sores.  Doesn't feel clothing or undergarments have rubbed tissue or are too tight.  Pt doesn't work.  Denies any strenuous activities that could have strained chest muscles.  Accompanied by her s.o.   Pt only speaks [place]; she has newly moved to [place]. From [place].  Her s.o. Has lived here for "9 yrs."  Pt's closest family is a cousin in [place].  Pt denies any fever, chills, or other illness.  GFM.  Expecting a boy.  No VB or LOF; no resp or GI c/o's or UTI s/s.  Pain has been consistent throughout the day; she has tried no relief measures prior to coming to MAU.   .. Patient Active Problem List  Diagnosis  . Late prenatal care  . Language Barrier    CSN: [id number]  Arrival date and time: 05/11/12 2126   First Provider Initiated Contact with Patient 05/11/12 2335      Chief Complaint  Patient presents with  . Breast Pain   HPI  OB History    Grav Para Term Preterm Abortions TAB SAB Ect Mult Living   1 0 0 0 0 0 0 0 0 0       Past Medical History  Diagnosis Date  . No pertinent past medical history   . Pregnant     Past Surgical History  Procedure Date  . No past surgeries     Family History  Problem Relation Age of Onset  . Other Neg Hx     History  Substance Use Topics  . Smoking status: Never Smoker   . Smokeless tobacco: Never Used  . Alcohol Use: No    Allergies: No Known Allergies  No prescriptions prior to admission    ROS---see HPI Physical Exam   Blood pressure 106/67, pulse 88, temperature 98.6 F (37 C), temperature source Oral, resp. rate 18, height 5\' 1"  (1.549 m), weight 130 lb 8 oz (59.194 kg), last menstrual period 12/03/2011.  Physical Exam  Constitutional:  She is oriented to person, place, and time. She appears well-developed and well-nourished. No distress.  HENT:  Head: Normocephalic.  Eyes: Pupils are equal, round, and reactive to light.  Cardiovascular: Normal rate.   Respiratory: Effort normal.  GI: Soft.       gravid  Genitourinary:       Pelvic deferred  Neurological: She is alert and oriented to person, place, and time.  Skin: Skin is warm and dry.  Breasts:  Bilateral breast WNL; no lesions, masses, or discolorations.  No erythema.  Bilateral nipples evert.    MAU Course  Procedures 1.  Physical exam 2. Language line  Assessment and Plan  1.  [redacted]w[redacted]d 2.  Lt breast pain likely r/t hyperplasia of pregnancy 3.  No s/s of abscess or infection 4.  Lang barrier 5.  Late to care  1.  D/c'd home after exam to f/u 05/14/12, or prn worsening s/s, fever, chills, boils/bumps, or other concerns 2.  Rec'd pt try warm compresses for comfort, and Motrin 600mg  po q6hr prn x2d 3.  U/s prn if worsening s/s or concern  Niall Illes H 05/11/2012, 11:55 PM

## 2012-05-12 ENCOUNTER — Encounter (HOSPITAL_COMMUNITY): Payer: Self-pay

## 2012-05-12 DIAGNOSIS — Z789 Other specified health status: Secondary | ICD-10-CM

## 2012-05-12 DIAGNOSIS — Z758 Other problems related to medical facilities and other health care: Secondary | ICD-10-CM

## 2012-05-12 DIAGNOSIS — Z603 Acculturation difficulty: Secondary | ICD-10-CM

## 2012-05-12 HISTORY — DX: Other specified health status: Z78.9

## 2012-05-12 HISTORY — DX: Acculturation difficulty: Z60.3

## 2012-05-14 ENCOUNTER — Ambulatory Visit (INDEPENDENT_AMBULATORY_CARE_PROVIDER_SITE_OTHER): Payer: Medicaid Other | Admitting: Obstetrics and Gynecology

## 2012-05-14 ENCOUNTER — Encounter: Payer: Self-pay | Admitting: Obstetrics and Gynecology

## 2012-05-14 VITALS — BP 88/60 | Wt 131.0 lb

## 2012-05-14 DIAGNOSIS — Z124 Encounter for screening for malignant neoplasm of cervix: Secondary | ICD-10-CM

## 2012-05-14 DIAGNOSIS — IMO0001 Reserved for inherently not codable concepts without codable children: Secondary | ICD-10-CM

## 2012-05-14 DIAGNOSIS — Z331 Pregnant state, incidental: Secondary | ICD-10-CM

## 2012-05-14 NOTE — Progress Notes (Signed)
EDD discrepancy 1 gtt given today without difficulty  FOB translates states pt has never had pap smear

## 2012-05-15 LAB — RPR

## 2012-05-16 ENCOUNTER — Encounter: Payer: Self-pay | Admitting: Obstetrics and Gynecology

## 2012-05-16 NOTE — Progress Notes (Signed)
Subjective:    Kelli Griffin is being seen today for her first obstetrical visit at [redacted]w[redacted]d gestation by [place] at 27 weeks.   Seen 7/19 at [facility] for [place], with EGA by LMP as 21 weeks, but 27 weeks by [place] that day, normal anatomy. Patient is from [place], [place], and has been in the [place] only a few months.  Husband is with her and speaks [place]. Denies any issues or concerns.  No previous USs have been done in pregnancy.  Had not received any prenatal care in [place] prior to coming to [place].  She denies any bleeding or cramping.  Her obstetrical history is significant for: Patient Active Problem List  Diagnosis  . Late prenatal care  . Language Barrier  . Dating by 27 week [place]    Relationship with FOB:  Husband, with her today--involved and supportive  Patient does intend to breast feed.   Pregnancy history fully reviewed.  The following portions of the patient's history were reviewed and updated as appropriate: allergies, current medications, past family history, past medical history, past social history, past surgical history and problem list.  Review of Systems Pertinent ROS is described in HPI   Objective:   BP 88/60  Wt 131 lb (59.421 kg)  LMP 12/03/2011 Wt Readings from Last 1 Encounters:  05/14/12 131 lb (59.421 kg)   BMI: There is no height on file to calculate BMI.  General: alert, cooperative and no distress Respiratory: clear to auscultation bilaterally Cardiovascular: regular rate and rhythm, S1, S2 normal, no murmur Breasts:  No dominant masses, nipples erect Gastrointestinal: soft, non-tender; no masses,  no organomegaly Extremities: extremities normal, no pain or edema Vaginal Bleeding: None  EXTERNAL GENITALIA: normal appearing vulva with no masses, tenderness or lesions VAGINA: no abnormal discharge or lesions CERVIX: no lesions or cervical motion tenderness; cervix closed, long, firm UTERUS: gravid and consistent with 29 weeks ADNEXA: no masses palpable and  nontender OB EXAM PELVIMETRY: Deferred at this visit due to patient's long wait.  Cervix closed on [place] 04/30/12.   FHR:  150  bpm  Assessment:    Pregnancy at  29 weeks Late to care Language barrier--speaks [place], from [place]  Plan:     Prenatal panel reviewed and discussed with the patient: Reviewed Pap smear collected: Deferred at today's visit GC/Chlamydia collected: Deferred at today's visit Wet prep:  NA Discussion of Genetic testing options: Declined Prenatal vitamins recommended Problem list reviewed and updated.  Plan of care: Follow up in 2 weeks for ROB Glucola, Hgb, RPR today. Plan pap, cultures, and clinical pelvimetry at GBS visit. Next visit with Dr. Estanislado Pandy.  Nigel Bridgeman CNM, [place] 05/14/12  3p

## 2012-05-26 ENCOUNTER — Encounter: Payer: Medicaid Other | Admitting: Obstetrics and Gynecology

## 2012-05-27 ENCOUNTER — Ambulatory Visit (INDEPENDENT_AMBULATORY_CARE_PROVIDER_SITE_OTHER): Payer: Medicaid Other | Admitting: Obstetrics and Gynecology

## 2012-05-27 VITALS — BP 98/60 | Wt 133.0 lb

## 2012-05-27 DIAGNOSIS — Z331 Pregnant state, incidental: Secondary | ICD-10-CM

## 2012-05-27 NOTE — Progress Notes (Signed)
102w6d. No complaints

## 2012-05-27 NOTE — Progress Notes (Signed)
Pt stated no issues today.  

## 2012-06-10 ENCOUNTER — Ambulatory Visit (INDEPENDENT_AMBULATORY_CARE_PROVIDER_SITE_OTHER): Payer: Medicaid Other | Admitting: Obstetrics and Gynecology

## 2012-06-10 ENCOUNTER — Encounter: Payer: Self-pay | Admitting: Obstetrics and Gynecology

## 2012-06-10 VITALS — BP 102/70 | Temp 98.8°F | Wt 132.0 lb

## 2012-06-10 DIAGNOSIS — Z3480 Encounter for supervision of other normal pregnancy, unspecified trimester: Secondary | ICD-10-CM

## 2012-06-10 DIAGNOSIS — O26849 Uterine size-date discrepancy, unspecified trimester: Secondary | ICD-10-CM

## 2012-06-10 NOTE — Patient Instructions (Signed)
Fetal Movement Counts Patient Name: __________________________________________________ Patient Due Date: ____________________ Kick counts is highly recommended in high risk pregnancies, but it is a good idea for every pregnant woman to do. Start counting fetal movements at 28 weeks of the pregnancy. Fetal movements increase after eating a full meal or eating or drinking something sweet (the blood sugar is higher). It is also important to drink plenty of fluids (well hydrated) before doing the count. Lie on your left side because it helps with the circulation or you can sit in a comfortable chair with your arms over your belly (abdomen) with no distractions around you. DOING THE COUNT  Try to do the count the same time of day each time you do it.   Mark the day and time, then see how long it takes for you to feel 10 movements (kicks, flutters, swishes, rolls). You should have at least 10 movements within 2 hours. You will most likely feel 10 movements in much less than 2 hours. If you do not, wait an hour and count again. After a couple of days you will see a pattern.   What you are looking for is a change in the pattern or not enough counts in 2 hours. Is it taking longer in time to reach 10 movements?  SEEK MEDICAL CARE IF:  You feel less than 10 counts in 2 hours. Tried twice.   No movement in one hour.   The pattern is changing or taking longer each day to reach 10 counts in 2 hours.   You feel the baby is not moving as it usually does.  Date: ____________ Movements: ____________ Start time: ____________ Finish time: ____________  Date: ____________ Movements: ____________ Start time: ____________ Finish time: ____________ Date: ____________ Movements: ____________ Start time: ____________ Finish time: ____________ Date: ____________ Movements: ____________ Start time: ____________ Finish time: ____________ Date: ____________ Movements: ____________ Start time: ____________ Finish time:  ____________ Date: ____________ Movements: ____________ Start time: ____________ Finish time: ____________ Date: ____________ Movements: ____________ Start time: ____________ Finish time: ____________ Date: ____________ Movements: ____________ Start time: ____________ Finish time: ____________  Date: ____________ Movements: ____________ Start time: ____________ Finish time: ____________ Date: ____________ Movements: ____________ Start time: ____________ Finish time: ____________ Date: ____________ Movements: ____________ Start time: ____________ Finish time: ____________ Date: ____________ Movements: ____________ Start time: ____________ Finish time: ____________ Date: ____________ Movements: ____________ Start time: ____________ Finish time: ____________ Date: ____________ Movements: ____________ Start time: ____________ Finish time: ____________ Date: ____________ Movements: ____________ Start time: ____________ Finish time: ____________  Date: ____________ Movements: ____________ Start time: ____________ Finish time: ____________ Date: ____________ Movements: ____________ Start time: ____________ Finish time: ____________ Date: ____________ Movements: ____________ Start time: ____________ Finish time: ____________ Date: ____________ Movements: ____________ Start time: ____________ Finish time: ____________ Date: ____________ Movements: ____________ Start time: ____________ Finish time: ____________ Date: ____________ Movements: ____________ Start time: ____________ Finish time: ____________ Date: ____________ Movements: ____________ Start time: ____________ Finish time: ____________  Date: ____________ Movements: ____________ Start time: ____________ Finish time: ____________ Date: ____________ Movements: ____________ Start time: ____________ Finish time: ____________ Date: ____________ Movements: ____________ Start time: ____________ Finish time: ____________ Date: ____________ Movements:  ____________ Start time: ____________ Finish time: ____________ Date: ____________ Movements: ____________ Start time: ____________ Finish time: ____________ Date: ____________ Movements: ____________ Start time: ____________ Finish time: ____________ Date: ____________ Movements: ____________ Start time: ____________ Finish time: ____________  Date: ____________ Movements: ____________ Start time: ____________ Finish time: ____________ Date: ____________ Movements: ____________ Start time: ____________ Finish time: ____________ Date: ____________ Movements: ____________ Start time:   ____________ Finish time: ____________ Date: ____________ Movements: ____________ Start time: ____________ Finish time: ____________ Date: ____________ Movements: ____________ Start time: ____________ Finish time: ____________ Date: ____________ Movements: ____________ Start time: ____________ Finish time: ____________ Date: ____________ Movements: ____________ Start time: ____________ Finish time: ____________  Date: ____________ Movements: ____________ Start time: ____________ Finish time: ____________ Date: ____________ Movements: ____________ Start time: ____________ Finish time: ____________ Date: ____________ Movements: ____________ Start time: ____________ Finish time: ____________ Date: ____________ Movements: ____________ Start time: ____________ Finish time: ____________ Date: ____________ Movements: ____________ Start time: ____________ Finish time: ____________ Date: ____________ Movements: ____________ Start time: ____________ Finish time: ____________ Date: ____________ Movements: ____________ Start time: ____________ Finish time: ____________  Date: ____________ Movements: ____________ Start time: ____________ Finish time: ____________ Date: ____________ Movements: ____________ Start time: ____________ Finish time: ____________ Date: ____________ Movements: ____________ Start time: ____________ Finish  time: ____________ Date: ____________ Movements: ____________ Start time: ____________ Finish time: ____________ Date: ____________ Movements: ____________ Start time: ____________ Finish time: ____________ Date: ____________ Movements: ____________ Start time: ____________ Finish time: ____________ Date: ____________ Movements: ____________ Start time: ____________ Finish time: ____________  Date: ____________ Movements: ____________ Start time: ____________ Finish time: ____________ Date: ____________ Movements: ____________ Start time: ____________ Finish time: ____________ Date: ____________ Movements: ____________ Start time: ____________ Finish time: ____________ Date: ____________ Movements: ____________ Start time: ____________ Finish time: ____________ Date: ____________ Movements: ____________ Start time: ____________ Finish time: ____________ Date: ____________ Movements: ____________ Start time: ____________ Finish time: ____________ Document Released: 10/29/2006 Document Revised: 09/18/2011 Document Reviewed: 05/01/2009 Exit[employer] Patient Information 2012 [employer]. 

## 2012-06-10 NOTE — Progress Notes (Signed)
Pt c/o swelling in feet also neck pain when swallowing.

## 2012-06-10 NOTE — Progress Notes (Signed)
Neck supple no masses.  Throat is clear [place] @[place]  S<D

## 2012-06-18 ENCOUNTER — Other Ambulatory Visit: Payer: Medicaid Other

## 2012-06-24 ENCOUNTER — Ambulatory Visit (INDEPENDENT_AMBULATORY_CARE_PROVIDER_SITE_OTHER): Payer: Medicaid Other | Admitting: Obstetrics and Gynecology

## 2012-06-24 ENCOUNTER — Ambulatory Visit (INDEPENDENT_AMBULATORY_CARE_PROVIDER_SITE_OTHER): Payer: Medicaid Other

## 2012-06-24 ENCOUNTER — Encounter: Payer: Self-pay | Admitting: Obstetrics and Gynecology

## 2012-06-24 VITALS — BP 100/68 | Wt 140.0 lb

## 2012-06-24 DIAGNOSIS — O26849 Uterine size-date discrepancy, unspecified trimester: Secondary | ICD-10-CM

## 2012-06-24 DIAGNOSIS — Z3480 Encounter for supervision of other normal pregnancy, unspecified trimester: Secondary | ICD-10-CM

## 2012-06-24 NOTE — Progress Notes (Signed)
Doing well--husband here as interpreter. [place]:  Vtx, EFW 2469g, 5+7, 39%ile. AFI 18.43, 65%ile. Repeat in 2 weeks to ensure appropriate linear growth.

## 2012-06-24 NOTE — Progress Notes (Signed)
S<d No concerns per pt (husband Nurse, learning disability)

## 2012-07-08 ENCOUNTER — Ambulatory Visit (INDEPENDENT_AMBULATORY_CARE_PROVIDER_SITE_OTHER): Payer: Medicaid Other

## 2012-07-08 ENCOUNTER — Ambulatory Visit (INDEPENDENT_AMBULATORY_CARE_PROVIDER_SITE_OTHER): Payer: Medicaid Other | Admitting: Obstetrics and Gynecology

## 2012-07-08 VITALS — BP 104/72 | Wt 139.0 lb

## 2012-07-08 DIAGNOSIS — Z331 Pregnant state, incidental: Secondary | ICD-10-CM

## 2012-07-08 DIAGNOSIS — O26849 Uterine size-date discrepancy, unspecified trimester: Secondary | ICD-10-CM

## 2012-07-08 NOTE — Progress Notes (Signed)
[redacted]w[redacted]d Ultrasound: SGA with normal AFI and change in growth curve from 39% to 18%. Will repeat in 2 weeks PRN GBS done Labor signs reviewed

## 2012-07-08 NOTE — Progress Notes (Signed)
[redacted]w[redacted]d Sono:  EFW: 5lb 13oz  18.7%  AFI  20.71    Vertex presentation posterior placenta, AFI is normal (75th%tile)  Suggest follow up for linear growth in 14 days due to drop in over all growth.  Percentile from 39% to 18%, and lagging abdominal circumference of 9.7% from a previous 41%.

## 2012-07-11 LAB — CULTURE, BETA STREP (GROUP B ONLY)

## 2012-07-13 ENCOUNTER — Other Ambulatory Visit: Payer: Self-pay | Admitting: Obstetrics and Gynecology

## 2012-07-13 DIAGNOSIS — O26849 Uterine size-date discrepancy, unspecified trimester: Secondary | ICD-10-CM

## 2012-07-13 LAB — US OB FOLLOW UP

## 2012-07-15 ENCOUNTER — Encounter: Payer: Self-pay | Admitting: Obstetrics and Gynecology

## 2012-07-15 ENCOUNTER — Ambulatory Visit (INDEPENDENT_AMBULATORY_CARE_PROVIDER_SITE_OTHER): Payer: Medicaid Other | Admitting: Obstetrics and Gynecology

## 2012-07-15 VITALS — BP 112/62 | Wt 140.0 lb

## 2012-07-15 DIAGNOSIS — Z331 Pregnant state, incidental: Secondary | ICD-10-CM

## 2012-07-15 NOTE — Progress Notes (Signed)
[redacted]w[redacted]d Doing well. Beta strep negative. Return to office in 1 week. Dr. Stefano Gaul

## 2012-07-15 NOTE — Progress Notes (Signed)
[redacted]w[redacted]d GBS Negative REQUEST CERVIX CHECK.

## 2012-07-22 ENCOUNTER — Encounter: Payer: Self-pay | Admitting: Obstetrics and Gynecology

## 2012-07-22 ENCOUNTER — Ambulatory Visit (INDEPENDENT_AMBULATORY_CARE_PROVIDER_SITE_OTHER): Payer: Medicaid Other | Admitting: Obstetrics and Gynecology

## 2012-07-22 VITALS — BP 108/66 | Wt 145.0 lb

## 2012-07-22 DIAGNOSIS — Z331 Pregnant state, incidental: Secondary | ICD-10-CM

## 2012-07-22 NOTE — Progress Notes (Signed)
[redacted]w[redacted]d Request cervix check.

## 2012-07-22 NOTE — Progress Notes (Signed)
Patient ID: Kelli Griffin, female   DOB: 02/15/1986, 26 y.o.   MRN: [id number] [redacted]w[redacted]d Reviewed s/s uc, srom, vag bleeding, daily fetal kick counts to report, comfort measures. Encouragged 8 water daily and frequent voids. Lavera Guise, CNM

## 2012-07-25 ENCOUNTER — Inpatient Hospital Stay (HOSPITAL_COMMUNITY)
Admission: AD | Admit: 2012-07-25 | Discharge: 2012-07-28 | DRG: 774 | Disposition: A | Discharge status: Home / Self Care | Payer: Medicaid Other | Source: Ambulatory Visit | Attending: Obstetrics and Gynecology | Admitting: Obstetrics and Gynecology

## 2012-07-25 DIAGNOSIS — D649 Anemia, unspecified: Secondary | ICD-10-CM | POA: Diagnosis present

## 2012-07-25 DIAGNOSIS — O9902 Anemia complicating childbirth: Principal | ICD-10-CM | POA: Diagnosis present

## 2012-07-26 ENCOUNTER — Encounter (HOSPITAL_COMMUNITY): Payer: Self-pay | Admitting: Anesthesiology

## 2012-07-26 ENCOUNTER — Encounter (HOSPITAL_COMMUNITY): Payer: Self-pay | Admitting: *Deleted

## 2012-07-26 ENCOUNTER — Inpatient Hospital Stay (HOSPITAL_COMMUNITY): Payer: Medicaid Other | Admitting: Anesthesiology

## 2012-07-26 LAB — OB RESULTS CONSOLE GBS: GBS: NEGATIVE

## 2012-07-26 LAB — CBC
HCT: 32.1 % — ABNORMAL LOW (ref 36.0–46.0)
Hemoglobin: 10.1 g/dL — ABNORMAL LOW (ref 12.0–15.0)
MCH: 21.8 pg — ABNORMAL LOW (ref 26.0–34.0)
MCHC: 31.5 g/dL (ref 30.0–36.0)
RBC: 4.64 MIL/uL (ref 3.87–5.11)

## 2012-07-26 LAB — RPR: RPR Ser Ql: NONREACTIVE

## 2012-07-26 MED ORDER — DIPHENHYDRAMINE HCL 25 MG PO CAPS: 25.0000 mg | ORAL_CAPSULE | Freq: Four times a day (QID) | ORAL | Status: DC | PRN

## 2012-07-26 MED ORDER — PHENYLEPHRINE 40 MCG/ML (10ML) SYRINGE FOR IV PUSH (FOR BLOOD PRESSURE SUPPORT)
80.0000 ug | PREFILLED_SYRINGE | INTRAVENOUS | Status: DC | PRN
  Filled 2012-07-26: qty 5

## 2012-07-26 MED ORDER — EPHEDRINE 5 MG/ML INJ: 10.0000 mg | INTRAVENOUS | Status: DC | PRN

## 2012-07-26 MED ORDER — LACTATED RINGERS IV SOLN
500.0000 mL | INTRAVENOUS | Status: DC | PRN
  Administered 2012-07-26: 1000 mL via INTRAVENOUS

## 2012-07-26 MED ORDER — HYDROXYZINE HCL 50 MG PO TABS: 50.0000 mg | ORAL_TABLET | Freq: Four times a day (QID) | ORAL | Status: DC | PRN

## 2012-07-26 MED ORDER — CITRIC ACID-SODIUM CITRATE 334-500 MG/5ML PO SOLN: 30.0000 mL | ORAL | Status: DC | PRN

## 2012-07-26 MED ORDER — DEXTROSE IN LACTATED RINGERS 5 % IV SOLN: INTRAVENOUS | Status: DC

## 2012-07-26 MED ORDER — OXYCODONE-ACETAMINOPHEN 5-325 MG PO TABS
1.0000 | ORAL_TABLET | ORAL | Status: DC | PRN
  Administered 2012-07-26 – 2012-07-27 (×2): 1 via ORAL
  Filled 2012-07-26 (×2): qty 1

## 2012-07-26 MED ORDER — DIPHENHYDRAMINE HCL 50 MG/ML IJ SOLN: 12.5000 mg | INTRAMUSCULAR | Status: DC | PRN

## 2012-07-26 MED ORDER — IBUPROFEN 600 MG PO TABS
600.0000 mg | ORAL_TABLET | Freq: Four times a day (QID) | ORAL | Status: DC
  Administered 2012-07-26 – 2012-07-28 (×6): 600 mg via ORAL
  Filled 2012-07-26 (×8): qty 1

## 2012-07-26 MED ORDER — DIBUCAINE 1 % RE OINT: 1.0000 "application " | TOPICAL_OINTMENT | RECTAL | Status: DC | PRN

## 2012-07-26 MED ORDER — WITCH HAZEL-GLYCERIN EX PADS
1.0000 | MEDICATED_PAD | CUTANEOUS | Status: DC | PRN
Start: 2012-07-26 — End: 2012-07-28

## 2012-07-26 MED ORDER — OXYTOCIN 40 UNITS IN LACTATED RINGERS INFUSION - SIMPLE MED
62.5000 mL/h | Freq: Once | INTRAVENOUS | Status: AC
  Administered 2012-07-26: 62.5 mL/h via INTRAVENOUS
  Filled 2012-07-26: qty 1000

## 2012-07-26 MED ORDER — PRENATAL MULTIVITAMIN CH
1.0000 | ORAL_TABLET | Freq: Every day | ORAL | Status: DC
  Administered 2012-07-27 – 2012-07-28 (×2): 1 via ORAL
  Filled 2012-07-26 (×2): qty 1

## 2012-07-26 MED ORDER — FENTANYL 2.5 MCG/ML BUPIVACAINE 1/10 % EPIDURAL INFUSION (WH - ANES)
14.0000 mL/h | INTRAMUSCULAR | Status: DC
  Administered 2012-07-26: 14 mL/h via EPIDURAL
  Filled 2012-07-26 (×2): qty 125

## 2012-07-26 MED ORDER — ONDANSETRON HCL 4 MG PO TABS: 4.0000 mg | ORAL_TABLET | ORAL | Status: DC | PRN

## 2012-07-26 MED ORDER — SIMETHICONE 80 MG PO CHEW: 80.0000 mg | CHEWABLE_TABLET | ORAL | Status: DC | PRN

## 2012-07-26 MED ORDER — IBUPROFEN 600 MG PO TABS: 600.0000 mg | ORAL_TABLET | Freq: Four times a day (QID) | ORAL | Status: DC | PRN

## 2012-07-26 MED ORDER — FENTANYL CITRATE 0.05 MG/ML IJ SOLN
INTRAMUSCULAR | Status: AC
Start: 2012-07-26 — End: 2012-07-26
  Administered 2012-07-26: 100 ug via INTRAVENOUS
  Filled 2012-07-26: qty 2

## 2012-07-26 MED ORDER — ONDANSETRON HCL 4 MG/2ML IJ SOLN: 4.0000 mg | Freq: Four times a day (QID) | INTRAMUSCULAR | Status: DC | PRN

## 2012-07-26 MED ORDER — LACTATED RINGERS IV SOLN: INTRAVENOUS | Status: DC

## 2012-07-26 MED ORDER — OXYTOCIN BOLUS FROM INFUSION
500.0000 mL | Freq: Once | INTRAVENOUS | Status: DC
  Filled 2012-07-26: qty 500

## 2012-07-26 MED ORDER — LIDOCAINE HCL (PF) 1 % IJ SOLN
30.0000 mL | INTRAMUSCULAR | Status: DC | PRN
  Administered 2012-07-26: 30 mL via SUBCUTANEOUS
  Filled 2012-07-26: qty 30

## 2012-07-26 MED ORDER — SENNOSIDES-DOCUSATE SODIUM 8.6-50 MG PO TABS
2.0000 | ORAL_TABLET | Freq: Every day | ORAL | Status: DC
  Administered 2012-07-26 – 2012-07-27 (×2): 2 via ORAL

## 2012-07-26 MED ORDER — HYDROXYZINE HCL 50 MG/ML IM SOLN
50.0000 mg | Freq: Four times a day (QID) | INTRAMUSCULAR | Status: DC | PRN
  Filled 2012-07-26: qty 1

## 2012-07-26 MED ORDER — FENTANYL CITRATE 0.05 MG/ML IJ SOLN
100.0000 ug | INTRAMUSCULAR | Status: DC | PRN
  Administered 2012-07-26 (×2): 100 ug via INTRAVENOUS
  Filled 2012-07-26: qty 2

## 2012-07-26 MED ORDER — TETANUS-DIPHTH-ACELL PERTUSSIS 5-2.5-18.5 LF-MCG/0.5 IM SUSP
0.5000 mL | Freq: Once | INTRAMUSCULAR | Status: AC
  Administered 2012-07-28: 0.5 mL via INTRAMUSCULAR

## 2012-07-26 MED ORDER — OXYTOCIN 40 UNITS IN LACTATED RINGERS INFUSION - SIMPLE MED
1.0000 m[IU]/min | INTRAVENOUS | Status: DC
  Administered 2012-07-26 (×2): 1 m[IU]/min via INTRAVENOUS

## 2012-07-26 MED ORDER — BENZOCAINE-MENTHOL 20-0.5 % EX AERO
1.0000 "application " | INHALATION_SPRAY | CUTANEOUS | Status: DC | PRN
  Administered 2012-07-27: 1 via TOPICAL
  Filled 2012-07-26: qty 56

## 2012-07-26 MED ORDER — FENTANYL 2.5 MCG/ML BUPIVACAINE 1/10 % EPIDURAL INFUSION (WH - ANES)
INTRAMUSCULAR | Status: DC | PRN
  Administered 2012-07-26: 14 mL/h via EPIDURAL

## 2012-07-26 MED ORDER — LACTATED RINGERS IV SOLN
500.0000 mL | Freq: Once | INTRAVENOUS | Status: AC
  Administered 2012-07-26: 1000 mL via INTRAVENOUS

## 2012-07-26 MED ORDER — LIDOCAINE HCL (PF) 1 % IJ SOLN
INTRAMUSCULAR | Status: DC | PRN
  Administered 2012-07-26 (×2): 9 mL

## 2012-07-26 MED ORDER — ACETAMINOPHEN 325 MG PO TABS: 650.0000 mg | ORAL_TABLET | ORAL | Status: DC | PRN

## 2012-07-26 MED ORDER — ONDANSETRON HCL 4 MG/2ML IJ SOLN: 4.0000 mg | INTRAMUSCULAR | Status: DC | PRN

## 2012-07-26 MED ORDER — NALBUPHINE SYRINGE 5 MG/0.5 ML
10.0000 mg | INJECTION | INTRAMUSCULAR | Status: DC | PRN
  Administered 2012-07-26: 5 mg via SUBCUTANEOUS
  Administered 2012-07-26: 10 mg via SUBCUTANEOUS
  Filled 2012-07-26: qty 0.5
  Filled 2012-07-26: qty 1

## 2012-07-26 MED ORDER — ZOLPIDEM TARTRATE 5 MG PO TABS: 5.0000 mg | ORAL_TABLET | Freq: Every evening | ORAL | Status: DC | PRN

## 2012-07-26 MED ORDER — LANOLIN HYDROUS EX OINT: TOPICAL_OINTMENT | CUTANEOUS | Status: DC | PRN

## 2012-07-26 MED ORDER — TERBUTALINE SULFATE 1 MG/ML IJ SOLN: 0.2500 mg | Freq: Once | INTRAMUSCULAR | Status: DC | PRN

## 2012-07-26 MED ORDER — PHENYLEPHRINE 40 MCG/ML (10ML) SYRINGE FOR IV PUSH (FOR BLOOD PRESSURE SUPPORT): 80.0000 ug | PREFILLED_SYRINGE | INTRAVENOUS | Status: DC | PRN

## 2012-07-26 MED ORDER — EPHEDRINE 5 MG/ML INJ
10.0000 mg | INTRAVENOUS | Status: DC | PRN
  Filled 2012-07-26: qty 4

## 2012-07-26 MED ORDER — INFLUENZA VIRUS VACC SPLIT PF IM SUSP
0.5000 mL | INTRAMUSCULAR | Status: AC
  Administered 2012-07-27: 0.5 mL via INTRAMUSCULAR

## 2012-07-26 MED ORDER — FERROUS SULFATE 325 (65 FE) MG PO TABS
325.0000 mg | ORAL_TABLET | Freq: Every day | ORAL | Status: DC
  Administered 2012-07-27 – 2012-07-28 (×2): 325 mg via ORAL
  Filled 2012-07-26: qty 1

## 2012-07-26 MED ORDER — FERROUS SULFATE 220 (44 FE) MG/5ML PO ELIX: 220.0000 mg | ORAL_SOLUTION | Freq: Every day | ORAL | Status: DC

## 2012-07-26 MED ORDER — OXYCODONE-ACETAMINOPHEN 5-325 MG PO TABS: 1.0000 | ORAL_TABLET | ORAL | Status: DC | PRN

## 2012-07-26 NOTE — Progress Notes (Signed)
Subjective: Pt has received 2 doses of IV Fentanyl and continues to cope poorly w/ ctxs and difficulty even relaxing between.  Crying and hitting side rails and pulling on husband during ctxs; labored breathing.  Again rec'd epidural, and pt agreeable at this time and anesthesia notified.  Objective: BP 110/76  Pulse 88  Temp 97.5 F (36.4 C) (Oral)  Resp 16  Ht 5\' 3"  (1.6 m)  Wt 134 lb (60.782 kg)  BMI 23.74 kg/m2  SpO2 99%  LMP 12/03/2011      FHT:  FHR: 140 bpm, variability: moderate,  accelerations:  Abscent,  decelerations:  Absent UC:   regular, every 3-5 minutes SVE:   Dilation: 5 Effacement (%): 100 Station: 0 Exam by:: Susa Bones, CNM  AROM large amt of moderate meconium fluid at 0553; cx rechecked now secondary to increasing pain, and unchanged. Labs: Lab Results  Component Value Date   WBC 9.8 07/26/2012   HGB 10.1* 07/26/2012   HCT 32.1* 07/26/2012   MCV 69.2* 07/26/2012   PLT 227 07/26/2012    Assessment / Plan: Spontaneous labor, progressing normally; MSF; language barrier; anemia;   Labor: Progressing normally Preeclampsia:  no signs or symptoms of toxicity Fetal Wellbeing:  Category I Pain Control:  Fentanyl I/D:  n/a Anticipated MOD:  NSVD 1.  Will prep for epidural and have remained at bedside since transfer from MAU. 2.  C/w MD prn Krystelle Prashad H 07/26/2012, 8:19 AM

## 2012-07-26 NOTE — Anesthesia Preprocedure Evaluation (Signed)
Anesthesia Evaluation  Patient identified by MRN, date of birth, ID band Patient awake    Reviewed: Allergy & Precautions, H&P , NPO status , Patient's Chart, lab work & pertinent test results  Airway Mallampati: II TM Distance: >3 FB Neck ROM: full    Dental No notable dental hx.    Pulmonary neg pulmonary ROS,  breath sounds clear to auscultation  Pulmonary exam normal       Cardiovascular negative cardio ROS      Neuro/Psych negative neurological ROS  negative psych ROS   GI/Hepatic negative GI ROS, Neg liver ROS,   Endo/Other  negative endocrine ROS  Renal/GU negative Renal ROS  negative genitourinary   Musculoskeletal negative musculoskeletal ROS (+)   Abdominal Normal abdominal exam  (+)   Peds negative pediatric ROS (+)  Hematology negative hematology ROS (+)   Anesthesia Other Findings   Reproductive/Obstetrics (+) Pregnancy                           Anesthesia Physical Anesthesia Plan  ASA: II  Anesthesia Plan: Epidural   Post-op Pain Management:    Induction:   Airway Management Planned:   Additional Equipment:   Intra-op Plan:   Post-operative Plan:   Informed Consent: I have reviewed the patients History and Physical, chart, labs and discussed the procedure including the risks, benefits and alternatives for the proposed anesthesia with the patient or authorized representative who has indicated his/her understanding and acceptance.     Plan Discussed with:   Anesthesia Plan Comments:         Anesthesia Quick Evaluation  

## 2012-07-26 NOTE — Progress Notes (Signed)
  Subjective: Remains comfortable with epidural.  No urge to push.  Objective: BP 125/88  Pulse 119  Temp 98.5 F (36.9 C) (Oral)  Resp 16  Ht 5\' 3"  (1.6 m)  Wt 134 lb (60.782 kg)  BMI 23.74 kg/m2  SpO2 100%  LMP 12/03/2011      FHT: Category 1 UC:  Q 3-4 min, mild/moderate. SVE:   Dilation: 10 Effacement (%): 100 Station: +1 Exam by:: Aliea Bobe, cnm Attempted trial of pushing, but patient has no urge.    Assessment / Plan: Will start pitocin augmentation to facilitate more pressure and thrust with contraction.  Nigel Bridgeman 07/26/2012, 2:12 PM

## 2012-07-26 NOTE — H&P (Signed)
Kelli Griffin is a 26 y.o. female presenting unannounced at [redacted]w[redacted]d in early labor.  Reports onset of ctxs around 1700 yesterday, but became very painful just before MN.  Reports underwear damp w/ some leakage for 3-4 days, but no gushes of fluid.  No VB.  GFM.  No recent illness or fever.  No GI or Resp c/o's tonight, but has struggled w/ n/v during pregnancy.   Prenatal Course:   Pt seen in MAU 12/18/11 by faculty practice and early pregnant, but did not start [facility] thereafter until 04/30/12 at [facility]. She had an anatomy u/s that day and size-date discrepancy changed EDC from Dec by LMP to 07/30/12 and AUA was 27 weeks.  She has been anemic during pregnancy, but no supplement except PNV.  Her 1hr gtt was WNL=101.  Growth u/s have been followed for fluctuating weight in the pregnancy and S<D, and 35 week u/s showed EFW at 39%; 37 week u/s showed growth then to be at 18%, and she hasn't had any further u/s before presentation today.   Maternal Medical History:  Reason for admission: Reason for admission: contractions.  Contractions: Onset was 6-12 hours ago.   Frequency: regular.   Perceived severity is strong.    Fetal activity: Perceived fetal activity is normal.   Last perceived fetal movement was within the past hour.    Prenatal complications: 1.  Late to care at 27 weeks 2.  Language barrier--French (from [place]--moved to [place] last January) 3.  Weight fluctuations in pregnancy 4.  Anemia 5.  Dates by 27 weeks u/s    OB History    Grav Para Term Preterm Abortions TAB SAB Ect Mult Living   1 0 0 0 0 0 0 0 0 0      Past Medical History  Diagnosis Date  . No pertinent past medical history   . Pregnant   . Language barrier 05/12/2012   Past Surgical History  Procedure Date  . No past surgeries    Family History: family history is negative for Other. Social History:  reports that she has never smoked. She has never used smokeless tobacco. She reports that she does not drink alcohol or use  illicit drugs.Husband "Oris Drone" is involved and supportive.  Pt came to [place] in January 2013; she is unemployed.  Husband works 2 jobs Teaching laboratory technician  Maternal Diabetes: No Genetic Screening: Declined Maternal Ultrasounds/Referrals: Normal Fetal Ultrasounds or other Referrals:  None Maternal Substance Abuse:  No Significant Maternal Medications:  None Significant Maternal Lab Results:  Lab values include: Group B Strep negative Other Comments:  None  Review of Systems  Constitutional: Negative.   HENT: Negative.   Eyes: Negative.   Respiratory: Negative.   Cardiovascular: Negative.   Gastrointestinal: Negative.   Genitourinary: Negative.   Skin: Negative.   Neurological: Negative.     Dilation: 4.5 Effacement (%): 100 Station: -1 Exam by:: H Rosalie Gelpi CNM Last menstrual period 12/03/2011. Maternal Exam:  Uterine Assessment: Contraction strength is firm.  Contraction frequency is regular.   Abdomen: Patient reports no abdominal tenderness. Introitus: Normal vulva. Pelvis: adequate for delivery.   Cervix: Cervix evaluated by digital exam.     Fetal Exam Fetal Monitor Review: Mode: ultrasound.   Baseline rate: 145.  Variability: moderate (6-25 bpm).   Pattern: accelerations present and no decelerations.      .. Results for orders placed during the hospital encounter of 07/25/12 (from the past 24 hour(s))  OB RESULTS CONSOLE GBS     Status: Normal      Component Value Range   GBS Negative    AMNISURE RUPTURE OF MEMBRANE (ROM)     Status: Normal   Collection Time   07/26/12 12:45 AM      Component Value Range   Amnisure ROM NEGATIVE    CBC     Status: Abnormal   Collection Time   07/26/12  4:45 AM      Component Value Range   WBC 9.8  4.0 - 10.5 K/uL   RBC 4.64  3.87 - 5.11 MIL/uL   Hemoglobin 10.1 (*) 12.0 - 15.0 g/dL   HCT 62.9 (*) 52.8 - 41.3 %   MCV 69.2 (*) 78.0 - 100.0 fL   MCH 21.8 (*) 26.0 -  34.0 pg   MCHC 31.5  30.0 - 36.0 g/dL   RDW 24.4  01.0 - 27.2 %   Platelets 227  150 - 400 K/uL   Physical Exam  Constitutional: She is oriented to person, place, and time. She appears well-developed and well-nourished. She appears distressed.       Very distressed, crying, moaning and writhing in bed w/ ctxs; able to rest for 1-2 hrs in MAU after receiving Nubain 10mg  SCx1, and after medicine wore off, return of more significant pain.  HENT:  Head: Normocephalic and atraumatic.  Eyes: Pupils are equal, round, and reactive to light.  Cardiovascular: Normal rate.   Respiratory: Effort normal.  GI: Soft.       gravid  Genitourinary:       cx on admission=3/80/-1, BBOW 3 hrs after arrived, cx changed to 4.5/100/-1, BBOW  Musculoskeletal: She exhibits edema.       Trace nonpitting pedal edema  Neurological: She is alert and oriented to person, place, and time. She has normal reflexes.  Skin: Skin is warm and dry.    Prenatal labs: ABO, Rh: O/POS/-- (07/19 0931) Antibody: NEG (07/19 0931) Rubella: 102.7 (07/19 0931) RPR: NON REAC (08/02 1546)  HBsAg: NEGATIVE (07/19 0931)  HIV: NON REACTIVE (07/19 0931)  GBS: Negative (10/14 0000)   Assessment/Plan  1.  [redacted]w[redacted]d 2. Active labor 3. Cat I FHT 4. GBS neg 5.  Language barrier 6.  amnisure negative 7. Anemia  1.  Admit to BS with Dr. Normand Sloop as attending 2.  Routine L&D orders 3.  Rec'd epidural; r/b/a disc'd, and pt declines, but agreeable to try Iv Fentanyl b/c she is very tense and difficulty coping during ctxs, and even in between ctxs as well 4.  AROM prn augmentation 5.  C/w MD prn  Berdena Cisek H 07/26/2012, 4:18 AM

## 2012-07-26 NOTE — MAU Note (Signed)
Pt reports uc's since 5pm and leaking of water 4 days

## 2012-07-26 NOTE — Progress Notes (Signed)
  Subjective: Epidural just placed, with husband as interpreter.  Patient getting more comfortable, very drowsy.  Had been screaming with contractions.  Objective: BP 110/76  Pulse 88  Temp 97.5 F (36.4 C) (Oral)  Resp 16  Ht 5\' 3"  (1.6 m)  Wt 134 lb (60.782 kg)  BMI 23.74 kg/m2  SpO2 99%  LMP 12/03/2011      FHT: Category 1 UC:   q 3-5 min SVE:   Dilation: 5 Effacement (%): 100 Station: 0 Exam by:: Steelman, CNM  at 6:50am.   Assessment / Plan: Progressive labor Epidural just placed Will await full patient comfort before re-examining--anticipate placing foley then.  Kelli Griffin 07/26/2012, 8:10 AM

## 2012-07-26 NOTE — Progress Notes (Signed)
  Subjective: Sleeping at intervals.  Felt some urinary pressure.  Objective: BP 116/80  Pulse 97  Temp 98.4 F (36.9 C) (Oral)  Resp 16  Ht 5\' 3"  (1.6 m)  Wt 134 lb (60.782 kg)  BMI 23.74 kg/m2  SpO2 100%  LMP 12/03/2011      FHT:  Category 1 UC:  q 3 min, moderate. SVE:   Dilation: Lip/rim Effacement (%): 100 Station: +1 Exam by:: Deneka Greenwalt, cnm Foley cath inserted  Assessment / Plan: Progressive labor Will await increased pressure.  Nigel Bridgeman 07/26/2012, 11:47 AM

## 2012-07-26 NOTE — Anesthesia Procedure Notes (Signed)
Epidural Patient location during procedure: OB Start time: 07/26/2012 7:34 AM End time: 07/26/2012 7:37 AM  Staffing Anesthesiologist: Sandrea Hughs Performed by: anesthesiologist   Preanesthetic Checklist Completed: patient identified, site marked, surgical consent, pre-op evaluation, timeout performed, IV checked, risks and benefits discussed and monitors and equipment checked  Epidural Patient position: sitting Prep: site prepped and draped and DuraPrep Patient monitoring: continuous pulse ox and blood pressure Approach: midline Injection technique: LOR air  Needle:  Needle type: Tuohy  Needle gauge: 17 G Needle length: 9 cm and 9 Needle insertion depth: 4 cm Catheter type: closed end flexible Catheter size: 19 Gauge Catheter at skin depth: 8 cm Test dose: negative  Assessment Sensory level: T8 Events: blood not aspirated, injection not painful, no injection resistance, negative IV test and no paresthesia  Additional Notes Reason for block:procedure for pain

## 2012-07-27 LAB — CBC
Hemoglobin: 8.1 g/dL — ABNORMAL LOW (ref 12.0–15.0)
MCV: 69.2 fL — ABNORMAL LOW (ref 78.0–100.0)
Platelets: 182 10*3/uL (ref 150–400)
RBC: 3.73 MIL/uL — ABNORMAL LOW (ref 3.87–5.11)
WBC: 20.6 10*3/uL — ABNORMAL HIGH (ref 4.0–10.5)

## 2012-07-27 MED ORDER — DOCUSATE SODIUM 100 MG PO CAPS
100.0000 mg | ORAL_CAPSULE | Freq: Two times a day (BID) | ORAL | Status: DC
  Administered 2012-07-27 – 2012-07-28 (×3): 100 mg via ORAL
  Filled 2012-07-27 (×3): qty 1

## 2012-07-27 MED ORDER — FERROUS SULFATE 325 (65 FE) MG PO TABS
325.0000 mg | ORAL_TABLET | Freq: Two times a day (BID) | ORAL | Status: DC
  Administered 2012-07-27: 325 mg via ORAL
  Filled 2012-07-27 (×2): qty 1

## 2012-07-27 NOTE — Anesthesia Postprocedure Evaluation (Signed)
  Anesthesia Post-op Note  Patient: Kelli Griffin  Procedure(s) Performed: * No procedures listed *  Patient Location: Mother/Baby  Anesthesia Type: Epidural  Level of Consciousness: awake  Airway and Oxygen Therapy: Patient Spontanous Breathing  Post-op Pain: none  Post-op Assessment: Patient's Cardiovascular Status Stable, Respiratory Function Stable, Patent Airway, No signs of Nausea or vomiting, Adequate PO intake, Pain level controlled, No headache, No backache, No residual numbness and No residual motor weakness  Post-op Vital Signs: Reviewed and stable  Complications: No apparent anesthesia complications

## 2012-07-27 NOTE — Progress Notes (Signed)
Post Partum Day 1 NSVD  Language Barrier. Tee patient speaks a dialect of [place]  Origin [place]) Subjective: no complaints, up ad lib without syncope, voiding, tolerating PO, + flatus  Pain well controlled with po meds BF on demand and baby latching feeding well Mood stable, bonding well Contraception: will discuss alter today when her husband is in the room with the patient.   Objective: Blood pressure 114/82, pulse 102, temperature 98.3 F (36.8 C), temperature source Oral, resp. rate 22, height 5\' 3"  (1.6 m), weight 134 lb (60.782 kg), last menstrual period 12/03/2011, SpO2 99.00%, unknown if currently breastfeeding.  Physical Exam:  General: alert, cooperative and no distress Lungs: CTAB Heart: RRR Breasts: N/T Lochia: appropriate Uterine Fundus: firm Perineum: healing well - had foley catheter in situ for swelling s/p delivery - can remove today. DVT Evaluation: No evidence of DVT seen on physical exam. Negative Homan's sign. No significant calf/ankle edema.   Basename 07/27/12 0550 07/26/12 0445  HGB 8.1* 10.1*  HCT 25.8* 32.1*   Postpartum anemia- to commence on Ferrous Sulfate 325mg  po BID and Colace 100mg  po BID Iron rich diet encouraged.  Assessment/Plan: Plan for discharge tomorrow      LOS: 2 days   Nereyda Bowler, CNM. 07/27/2012, 8:22 AM

## 2012-07-27 NOTE — Progress Notes (Signed)
UR chart review completed.  

## 2012-07-28 MED ORDER — DSS 100 MG PO CAPS: 100.0000 mg | ORAL_CAPSULE | Freq: Two times a day (BID) | ORAL | Status: DC

## 2012-07-28 MED ORDER — IBUPROFEN 600 MG PO TABS: 600.0000 mg | ORAL_TABLET | Freq: Four times a day (QID) | ORAL | Status: DC | PRN

## 2012-07-29 ENCOUNTER — Inpatient Hospital Stay (HOSPITAL_COMMUNITY)
Admission: AD | Admit: 2012-07-29 | Discharge: 2012-07-29 | Disposition: A | Discharge status: Home / Self Care | Payer: Medicaid Other | Source: Ambulatory Visit | Attending: Obstetrics and Gynecology | Admitting: Obstetrics and Gynecology

## 2012-07-29 ENCOUNTER — Encounter: Payer: Medicaid Other | Admitting: Obstetrics and Gynecology

## 2012-07-29 DIAGNOSIS — O9279 Other disorders of lactation: Secondary | ICD-10-CM | POA: Insufficient documentation

## 2012-07-29 MED ORDER — IBUPROFEN 800 MG PO TABS
800.0000 mg | ORAL_TABLET | Freq: Three times a day (TID) | ORAL | Status: DC
  Administered 2012-07-29: 800 mg via ORAL
  Filled 2012-07-29: qty 1

## 2012-07-29 MED ORDER — OXYCODONE-ACETAMINOPHEN 5-325 MG PO TABS
1.0000 | ORAL_TABLET | ORAL | Status: DC | PRN
  Administered 2012-07-29: 1 via ORAL
  Filled 2012-07-29: qty 1

## 2012-07-29 NOTE — Consult Note (Signed)
Lactation Note:  LC requested to see mother in MAU for severe engorgement.  This is mom's first baby and she and baby were discharged home yesterday with newborn nursing well.  Mom speaks [place] and husband present to translate.  Parent's report that baby nursed every 3 hours since discharge but still acts hungry.  Baby had weight check at [facility] today and FOB reports weight was reported as good. Patient's breasts are very engorged and firm.  Mom is in severe pain and unable to tolerate the baby nursing.  Ice packs applied to breasts for 15 minutes and then pumped with DEBP using good breast massage.  RN gave mom 800 mg of ibuprofen and 1 percocet.  Breasts pumped for 20 minutes and 80-90 mls of transitional milk obtained from each breast and significant softening achieved.  Assisted mom with latching the baby on deeply and baby nursed well with audible gulping and mother was comfortable.  Instructed parents the importance of a deep latch and how to obtain it.  Mother was much more comfortable at discharge. Detailed written instructions reviewed with parents and they verbalized understanding.  Lactation office phone number given if needed parents will call for further assist.

## 2012-07-29 NOTE — MAU Provider Note (Signed)
History    26 yo G1P1 s/p SVB on 10/14 presented unannounced c/o severe pain with latch on both breasts and breasts feeling very full.  Denies fever, N/V, or any other issues.  Pain has become worse last 2 days, but milk production has been good.  Baby is a very vigorous feeder.  No bleeding from nipples.  Has not been taking Ibuprophen regularly.  Husband and baby are with patient today.  Patient speaks [place], with husband as Nurse, learning disability.  Chief Complaint  Patient presents with  . Breast Pain   Patient Active Problem List  Diagnosis  . Late prenatal care  . Language Barrier  . Dating by 27 week [place]  . Vaginal delivery  . Midline episiotomy     OB History    Grav Para Term Preterm Abortions TAB SAB Ect Mult Living   1 1 1  0 0 0 0 0 0 0      Past Medical History  Diagnosis Date  . No pertinent past medical history   . Pregnant   . Language barrier 05/12/2012    Past Surgical History  Procedure Date  . No past surgeries     Family History  Problem Relation Age of Onset  . Other Neg Hx     History  Substance Use Topics  . Smoking status: Never Smoker   . Smokeless tobacco: Never Used  . Alcohol Use: No    Allergies: No Known Allergies  Prescriptions prior to admission  Medication Sig Dispense Refill  . Acetaminophen (TYLENOL PO) Take by mouth.      . docusate sodium 100 MG CAPS Take 100 mg by mouth 2 (two) times daily.  20 capsule  0  . ibuprofen (ADVIL,MOTRIN) 600 MG tablet Take 1 tablet (600 mg total) by mouth every 6 (six) hours as needed for pain.  30 tablet  2  . Prenatal Vit-Fe Fumarate-FA (PRENATAL MULTIVITAMIN) TABS Take 1 tablet by mouth daily.         Physical Exam   Blood pressure 152/93, pulse 115, temperature 99.4 F (37.4 C), resp. rate 18, last menstrual period 12/03/2011, unknown if currently breastfeeding.  Chest clear Heart RRR without murmur Abd soft, NT, uterine involution WNL Breasts--very engorged and full.  Nipples very sore and  red.  No evidence mastitis in either breast.  ED Course  LC, Lelon Perla, in to see patient.  Utilized ice and pumping to relieve severe engorgement with success. Gel pads to nipples for comfort. Ibuprophen 800 mg x 1 and Percocet 1 po given to patient in MAU. LC gave patient extensive instruction and education re:  Prevention and treatment of engorgement, as well as of the s/s of mastitis. I instructed patient via husband to take Ibuprophen 800 mg po TID x 1-2 days, in addition to the instructions from the [facility]. Patient/husband to call LCs for specific questions--may also call [facility] for office evaluation or return to MAU with issues, as needed.   Nigel Bridgeman CNM, [place] 07/29/2012 3:52 PM

## 2012-07-29 NOTE — MAU Note (Signed)
Pt states pain in breast when baby is feeding. States he is eating alot

## 2012-08-02 ENCOUNTER — Encounter (HOSPITAL_COMMUNITY): Payer: Self-pay | Admitting: *Deleted

## 2012-08-09 NOTE — Discharge Summary (Signed)
Obstetric Discharge Summary Reason for Admission: onset of labor Prenatal Procedures: ultrasound Intrapartum Procedures: spontaneous vaginal delivery and MLE Postpartum Procedures: none Complications-Operative and Postpartum: fever immediately PP, resolved without further intervention, anemia, edematous perienum  Hemoglobin  Date Value Range Status  07/27/2012 8.1* 12.0 - 15.0 g/dL Final     DELTA CHECK NOTED     REPEATED TO VERIFY     HCT  Date Value Range Status  07/27/2012 25.8* 36.0 - 46.0 % Final    Hospital course: Pt was admitted in early labor, rcv'd IV fentanyl, AROM, epidural, MLE, increased temp was noted immediately after delivery and resolved without any interventions. PP anemia was noted, pt was asymptomatic. Pt had a routine PP course, w BF going well. Pt declined any contraception.   Physical Exam:  General: alert and no distress Lochia: appropriate Uterine Fundus: firm Incision: healing well DVT Evaluation: No evidence of DVT seen on physical exam. Negative Homan's sign. No significant calf/ankle edema.  Discharge Diagnoses: Term Pregnancy-delivered and anemia  Discharge Information: Date: 08/09/2012 Activity: pelvic rest Diet: routine and FE rich Medications: PNV, Ibuprofen and Iron Condition: stable Instructions: refer to practice specific booklet Discharge to: home Follow-up Information    Follow up with [facility]. (Call the office to schedule an appointment with [name] in 5 weeks. You may also schedule a circumcision if you desire in 2-3 weeks, the office can tell you the cost. )    Contact information:   [address] [place] [address] [phone]         Newborn Data: Live born female  Birth Weight: 6 lb 7 oz (2920 g) APGAR: 8, 9  Home with mother.  Kelli Griffin M 08/09/2012, 8:21 AM (late entry)

## 2012-09-01 ENCOUNTER — Ambulatory Visit (INDEPENDENT_AMBULATORY_CARE_PROVIDER_SITE_OTHER): Payer: Self-pay | Admitting: Obstetrics and Gynecology

## 2012-09-01 ENCOUNTER — Encounter: Payer: Self-pay | Admitting: Obstetrics and Gynecology

## 2012-09-01 DIAGNOSIS — Z124 Encounter for screening for malignant neoplasm of cervix: Secondary | ICD-10-CM

## 2012-09-01 NOTE — Progress Notes (Signed)
Kelli Griffin  is 5 weeks postpartum following a spontaneous vaginal delivery at 9 gestational weeks Date: 07-26-2012 female baby named [name] delivered by  VL, CNM  Breastfeeding: yes Bottlefeeding:  no  Post-partum blues / depression:  no  EPDS score: 0  History of abnormal Pap:  no  Last Pap: Date  N/A pap was cancelled  Gestational diabetes:  no  Contraception:  Desires no method "Not yet per husband"  Normal urinary function:  yes Normal GI function:  No "pt states having a hard time having bowel movements.  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 female who presents for a postpartum visit.  I have fully reviewed the prenatal and intrapartum course.    Patient is not sexually active.   The following portions of the patient's history were reviewed and updated as appropriate: allergies, current medications, past family history, past medical history, past social history, past surgical history and problem list.  Review of Systems Pertinent items are noted in HPI.   Objective:    BP 100/62  Temp 98.3 F (36.8 C) (Oral)  Wt 125 lb (56.7 kg)  Breastfeeding? Yes  General:  alert, cooperative and no distress     Lungs: clear to auscultation bilaterally  Heart:  regular rate and rhythm, S1, S2 normal, no murmur  Abdomen: soft, non-tender; bowel sounds normal; no masses,  no organomegaly   Vulva:  normal  Vagina: normal vagina  Cervix:  normal  Corpus: normal size, contour, position, consistency, mobility, non-tender  Adnexa:  normal adnexa             Assessment:     Normal postpartum exam.  Pap smear done at today's visit.   Plan:  Stool Softener Reccommended   Silverio Lay MD 09/01/2012 4:30 PM     Silverio Lay MD

## 2012-09-02 LAB — PAP IG W/ RFLX HPV ASCU

## 2012-12-11 ENCOUNTER — Encounter (HOSPITAL_COMMUNITY): Payer: Self-pay

## 2012-12-11 ENCOUNTER — Emergency Department (HOSPITAL_COMMUNITY): Payer: Medicaid Other

## 2012-12-11 ENCOUNTER — Emergency Department (HOSPITAL_COMMUNITY)
Admission: EM | Admit: 2012-12-11 | Discharge: 2012-12-11 | Disposition: A | Discharge status: Home / Self Care | Payer: Medicaid Other | Attending: Emergency Medicine | Admitting: Emergency Medicine

## 2012-12-11 DIAGNOSIS — Z79899 Other long term (current) drug therapy: Secondary | ICD-10-CM | POA: Insufficient documentation

## 2012-12-11 DIAGNOSIS — R112 Nausea with vomiting, unspecified: Secondary | ICD-10-CM | POA: Insufficient documentation

## 2012-12-11 DIAGNOSIS — R51 Headache: Secondary | ICD-10-CM | POA: Insufficient documentation

## 2012-12-11 LAB — POCT I-STAT, CHEM 8
Calcium, Ion: 1.14 mmol/L (ref 1.12–1.23)
HCT: 37 % (ref 36.0–46.0)
Sodium: 143 mEq/L (ref 135–145)
TCO2: 24 mmol/L (ref 0–100)

## 2012-12-11 MED ORDER — SODIUM CHLORIDE 0.9 % IV BOLUS (SEPSIS)
1000.0000 mL | Freq: Once | INTRAVENOUS | Status: AC
  Administered 2012-12-11: 1000 mL via INTRAVENOUS

## 2012-12-11 MED ORDER — DEXAMETHASONE SODIUM PHOSPHATE 10 MG/ML IJ SOLN
10.0000 mg | Freq: Once | INTRAMUSCULAR | Status: AC
  Administered 2012-12-11: 10 mg via INTRAVENOUS
  Filled 2012-12-11: qty 1

## 2012-12-11 MED ORDER — ACETAMINOPHEN 325 MG PO TABS
650.0000 mg | ORAL_TABLET | Freq: Once | ORAL | Status: AC
  Administered 2012-12-11: 650 mg via ORAL
  Filled 2012-12-11: qty 2

## 2012-12-11 MED ORDER — PROCHLORPERAZINE EDISYLATE 5 MG/ML IJ SOLN
10.0000 mg | Freq: Once | INTRAMUSCULAR | Status: AC
  Administered 2012-12-11: 10 mg via INTRAVENOUS
  Filled 2012-12-11: qty 2

## 2012-12-11 NOTE — ED Notes (Signed)
Patient transported to CT 

## 2012-12-11 NOTE — ED Notes (Signed)
Pt does not speak english. Husband interprets for her. Pt reports developing a headache since yesterday. Reports not taking any medication for the headache. States nausea with the headache but not actual vomiting. Reports not eating or drinking anything today. Denies photophobia or blurry vision. Reports headache 8/10. Pt alert and oriented x 4, neuro intact.

## 2012-12-11 NOTE — ED Notes (Signed)
Pt c/o severe headache since yesterday. Today developed nausea and has been dry heaving.

## 2012-12-11 NOTE — ED Provider Notes (Signed)
History     CSN: [id number]  Arrival date & time 12/11/12  1622   First MD Initiated Contact with Patient 12/11/12 1728      Chief Complaint  Patient presents with  . Headache  . Emesis    HPI Kelli Griffin is a 27 y.o. female who presents to the ED with headache.  Reports that it is above L eye.  Insideous onset 2 days ago.  Not worst of life.  No fevers.  No chills.  No neck stiffness.  No unilateral weakness or numbness.  No vision changes.  No problems with speech.  No other symptoms.  Pain sharp, 8/10 in severity, no radiation, not alleviated by tylenol.  Nursing note mentioning vomiting, but patient repeatedly denying this to me.  Only nausea.  No other factors.  Past Medical History  Diagnosis Date  . No pertinent past medical history   . Pregnant   . Language barrier 05/12/2012    Past Surgical History  Procedure Laterality Date  . No past surgeries      Family History  Problem Relation Age of Onset  . Other Neg Hx     History  Substance Use Topics  . Smoking status: Never Smoker   . Smokeless tobacco: Never Used  . Alcohol Use: No    OB History   Grav Para Term Preterm Abortions TAB SAB Ect Mult Living   1 1 1  0 0 0 0 0 0 1      Review of Systems  Constitutional: Negative for fever and chills.  HENT: Negative for congestion, rhinorrhea, neck pain and neck stiffness.   Respiratory: Negative for cough and shortness of breath.   Cardiovascular: Negative for chest pain.  Gastrointestinal: Positive for nausea. Negative for vomiting, abdominal pain, diarrhea and abdominal distention.  Endocrine: Negative for polyuria.  Genitourinary: Negative for dysuria.  Skin: Negative for rash.  Neurological: Positive for headaches.  Psychiatric/Behavioral: Negative.   All other systems reviewed and are negative.    Allergies  Review of patient's allergies indicates no known allergies.  Home Medications   Current Outpatient Rx  Name  Route  Sig  Dispense  Refill   . Acetaminophen (TYLENOL PO)   Oral   Take by mouth.         . docusate sodium 100 MG CAPS   Oral   Take 100 mg by mouth 2 (two) times daily.   20 capsule   0   . ibuprofen (ADVIL,MOTRIN) 600 MG tablet   Oral   Take 1 tablet (600 mg total) by mouth every 6 (six) hours as needed for pain.   30 tablet   2   . Prenatal Vit-Fe Fumarate-FA (PRENATAL MULTIVITAMIN) TABS   Oral   Take 1 tablet by mouth daily.           BP 143/96  Pulse 63  Temp(Src) 98.3 F (36.8 C) (Oral)  Resp 18  SpO2 98%  Physical Exam  Nursing note and vitals reviewed. Constitutional: She is oriented to person, place, and time. She appears well-developed and well-nourished. No distress.  HENT:  Head: Normocephalic and atraumatic.  Right Ear: External ear normal.  Left Ear: External ear normal.  Nose: Nose normal.  Mouth/Throat: Oropharynx is clear and moist. No oropharyngeal exudate.  Eyes: EOM are normal. Pupils are equal, round, and reactive to light.  Neck: Normal range of motion. Neck supple. No tracheal deviation present.  Cardiovascular: Normal rate.   Pulmonary/Chest: Effort normal and breath sounds  normal. No stridor. No respiratory distress. She has no wheezes. She has no rales.  Abdominal: Soft. She exhibits no distension. There is no tenderness. There is no rebound.  Musculoskeletal: Normal range of motion.  Neurological: She is alert and oriented to person, place, and time. She has normal strength and normal reflexes. No cranial nerve deficit or sensory deficit. Coordination and gait normal. GCS eye subscore is 4. GCS verbal subscore is 5. GCS motor subscore is 6.  Skin: Skin is warm and dry. She is not diaphoretic.    ED Course  Procedures (including critical care time)  Labs Reviewed - No data to display No results found.   No diagnosis found.    MDM   Patient is a 27 year old female who presented to the emergency department with headache above left eye for 2 days. No  sudden onset or worst headache of life. Doubt subarachnoid hemorrhage. No neck stiffness or fever to indicate meningitis. Patient without vision changes or concern for acute angle closure glaucoma. CT of the head performed to rule out mass or abscess negative. Headache cocktail given. Patient with symptoms much improved. A 0/10 on reevaluation. Patient safe for discharge. Recommend close followup with PCP. Patient discharged.      Arloa Koh, MD 12/11/12 2311

## 2012-12-12 NOTE — ED Provider Notes (Addendum)
I saw a the patient, reviewed the resident's note and I agree with the findings and plan.   .Face to face Exam:  General:  Awake HEENT:  Atraumatic Resp:  Normal effort Abd:  Nondistended Neuro:No focal weakness    Nelia Shi, MD 12/12/12 1515  Nelia Shi, MD 01/15/13 [phone]

## 2013-04-27 ENCOUNTER — Encounter (HOSPITAL_COMMUNITY): Payer: Self-pay | Admitting: *Deleted

## 2013-04-27 ENCOUNTER — Emergency Department (HOSPITAL_COMMUNITY)
Admission: EM | Admit: 2013-04-27 | Discharge: 2013-04-28 | Disposition: A | Discharge status: Home / Self Care | Payer: Medicaid Other | Attending: Emergency Medicine | Admitting: Emergency Medicine

## 2013-04-27 DIAGNOSIS — Y939 Activity, unspecified: Secondary | ICD-10-CM | POA: Insufficient documentation

## 2013-04-27 DIAGNOSIS — N61 Mastitis without abscess: Secondary | ICD-10-CM

## 2013-04-27 DIAGNOSIS — Y929 Unspecified place or not applicable: Secondary | ICD-10-CM | POA: Insufficient documentation

## 2013-04-27 DIAGNOSIS — E876 Hypokalemia: Secondary | ICD-10-CM

## 2013-04-27 DIAGNOSIS — M545 Low back pain, unspecified: Secondary | ICD-10-CM | POA: Insufficient documentation

## 2013-04-27 DIAGNOSIS — X58XXXA Exposure to other specified factors, initial encounter: Secondary | ICD-10-CM | POA: Insufficient documentation

## 2013-04-27 DIAGNOSIS — S335XXA Sprain of ligaments of lumbar spine, initial encounter: Secondary | ICD-10-CM | POA: Insufficient documentation

## 2013-04-27 DIAGNOSIS — S39012A Strain of muscle, fascia and tendon of lower back, initial encounter: Secondary | ICD-10-CM

## 2013-04-27 LAB — URINALYSIS, ROUTINE W REFLEX MICROSCOPIC
Bilirubin Urine: NEGATIVE
Glucose, UA: NEGATIVE mg/dL
Hgb urine dipstick: NEGATIVE
Ketones, ur: >80 mg/dL — AB
Protein, ur: NEGATIVE mg/dL

## 2013-04-27 LAB — CBC
Platelets: 192 10*3/uL (ref 150–400)
RBC: 5.29 MIL/uL — ABNORMAL HIGH (ref 3.87–5.11)
RDW: 14.7 % (ref 11.5–15.5)
WBC: 16 10*3/uL — ABNORMAL HIGH (ref 4.0–10.5)

## 2013-04-27 LAB — COMPREHENSIVE METABOLIC PANEL
ALT: 16 U/L (ref 0–35)
AST: 25 U/L (ref 0–37)
Albumin: 3.8 g/dL (ref 3.5–5.2)
Alkaline Phosphatase: 106 U/L (ref 39–117)
Chloride: 101 mEq/L (ref 96–112)
Potassium: 3 mEq/L — ABNORMAL LOW (ref 3.5–5.1)
Sodium: 134 mEq/L — ABNORMAL LOW (ref 135–145)
Total Bilirubin: 0.3 mg/dL (ref 0.3–1.2)

## 2013-04-27 MED ORDER — SODIUM CHLORIDE 0.9 % IV BOLUS (SEPSIS)
1000.0000 mL | Freq: Once | INTRAVENOUS | Status: AC
  Administered 2013-04-27: 1000 mL via INTRAVENOUS

## 2013-04-27 MED ORDER — POTASSIUM CHLORIDE CRYS ER 20 MEQ PO TBCR
40.0000 meq | EXTENDED_RELEASE_TABLET | Freq: Once | ORAL | Status: AC
  Administered 2013-04-27: 40 meq via ORAL
  Filled 2013-04-27: qty 2

## 2013-04-27 MED ORDER — IBUPROFEN 800 MG PO TABS: 800.0000 mg | ORAL_TABLET | Freq: Three times a day (TID) | ORAL | Status: DC

## 2013-04-27 MED ORDER — AMOXICILLIN-POT CLAVULANATE 875-125 MG PO TABS: 1.0000 | ORAL_TABLET | Freq: Two times a day (BID) | ORAL | Status: DC

## 2013-04-27 NOTE — ED Notes (Signed)
Pt's husband states that she started having breast pain while feeding their baby. He states that she then started having chills, and developed a headache and back pain. The pt does not speak [place], but her husband does. The pt speaks [place].

## 2013-04-27 NOTE — ED Provider Notes (Signed)
History    CSN: [id number] Arrival date & time 04/27/13  1745  First MD Initiated Contact with Patient 04/27/13 2006     Chief Complaint  Patient presents with  . Breast Pain   (Consider location/radiation/quality/duration/timing/severity/associated sxs/prior Treatment) The history is provided by the patient and medical records. A language interpreter was used.    Kelli Griffin is a 27 y.o. female  With no known medical hx presents to the Emergency Department complaining of gradual, persistent, progressively worsening right breast pain and tenderness which began days ago. Patient states she is a 42-month-old baby who still breast-feeds.  Pain is worsened by nursing her baby; that is present at all times and also increased with palpation. She's also noticed redness of the right breast. No symptoms of the left breast. Patient also endorses low back pain made worse by movement. She has not tried anything for the pain. She denies trauma including falls or known injury. She has not had back pain in the past. She denies numbness, tingling, difficulty walking, saddle anesthesia, loss of bowel or bladder function. Patient also denies fever, chills, headache, neck pain, chest pain, shortness of breath, nausea, vomiting, diarrhea, weakness, dizziness.  Past Medical History  Diagnosis Date  . No pertinent past medical history   . Pregnant   . Language barrier 05/12/2012   Past Surgical History  Procedure Laterality Date  . No past surgeries     Family History  Problem Relation Age of Onset  . Other Neg Hx    History  Substance Use Topics  . Smoking status: Never Smoker   . Smokeless tobacco: Never Used  . Alcohol Use: No   OB History   Grav Para Term Preterm Abortions TAB SAB Ect Mult Living   1 1 1  0 0 0 0 0 0 1     Review of Systems  Constitutional: Negative for fever, diaphoresis, appetite change, fatigue and unexpected weight change.  HENT: Negative for mouth sores and neck  stiffness.   Eyes: Negative for visual disturbance.  Respiratory: Negative for cough, chest tightness, shortness of breath and wheezing.   Cardiovascular: Negative for chest pain.  Gastrointestinal: Negative for nausea, vomiting, abdominal pain, diarrhea and constipation.  Endocrine: Negative for polydipsia, polyphagia and polyuria.  Genitourinary: Negative for dysuria, urgency, frequency and hematuria.       Breast pain  Musculoskeletal: Positive for back pain.  Skin: Negative for rash.  Allergic/Immunologic: Negative for immunocompromised state.  Neurological: Negative for syncope, light-headedness and headaches.  Hematological: Does not bruise/bleed easily.  Psychiatric/Behavioral: Negative for sleep disturbance. The patient is not nervous/anxious.     Allergies  Review of patient's allergies indicates no known allergies.  Home Medications   Current Outpatient Rx  Name  Route  Sig  Dispense  Refill  . acetaminophen (TYLENOL) 325 MG tablet   Oral   Take 650 mg by mouth every 6 (six) hours as needed for pain.         Marland Kitchen ibuprofen (ADVIL,MOTRIN) 200 MG tablet   Oral   Take 600 mg by mouth every 6 (six) hours as needed for pain.         Marland Kitchen amoxicillin-clavulanate (AUGMENTIN) 875-125 MG per tablet   Oral   Take 1 tablet by mouth every 12 (twelve) hours.   14 tablet   0   . ibuprofen (ADVIL,MOTRIN) 800 MG tablet   Oral   Take 1 tablet (800 mg total) by mouth 3 (three) times daily.   21  tablet   0    BP 116/81  Pulse 106  Temp(Src) 99 F (37.2 C) (Oral)  Resp 16  Ht 5\' 1"  (1.549 m)  Wt 120 lb (54.432 kg)  BMI 22.69 kg/m2  SpO2 100% Physical Exam  Nursing note and vitals reviewed. Constitutional: She is oriented to person, place, and time. She appears well-developed and well-nourished. No distress.  HENT:  Head: Normocephalic and atraumatic.  Mouth/Throat: Oropharynx is clear and moist. No oropharyngeal exudate.  Eyes: Conjunctivae are normal.  Neck: Trachea  normal, normal range of motion, full passive range of motion without pain and phonation normal. Neck supple. No spinous process tenderness and no muscular tenderness present. No rigidity. Normal range of motion present.  Full ROM without pain  Cardiovascular: Normal rate, regular rhythm, S1 normal, S2 normal, normal heart sounds and intact distal pulses.   No murmur heard. Pulses:      Radial pulses are 2+ on the right side, and 2+ on the left side.       Dorsalis pedis pulses are 2+ on the right side, and 2+ on the left side.       Posterior tibial pulses are 2+ on the right side, and 2+ on the left side.  Capillary refill less than 3 seconds  Pulmonary/Chest: Effort normal and breath sounds normal. No respiratory distress. She has no wheezes. She has no rales. She exhibits no tenderness. Right breast exhibits skin change and tenderness.  Patient with erythema, mild induration, increased warmth of the superior portion of the right breast; patient also with cracked right nipple  Abdominal: Soft. She exhibits no distension. There is no tenderness.  Musculoskeletal:  Full range of motion of the T-spine and L-spine No tenderness to palpation of the spinous processes of the T-spine or L-spine Mild tenderness to palpation of the paraspinous muscles of the L-spine  Lymphadenopathy:    She has no cervical adenopathy.  Neurological: She is alert and oriented to person, place, and time. She has normal reflexes. She exhibits normal muscle tone. Coordination normal. GCS eye subscore is 4. GCS verbal subscore is 5. GCS motor subscore is 6.  Reflex Scores:      Tricep reflexes are 2+ on the right side and 2+ on the left side.      Bicep reflexes are 2+ on the right side and 2+ on the left side.      Brachioradialis reflexes are 2+ on the right side and 2+ on the left side.      Patellar reflexes are 2+ on the right side and 2+ on the left side.      Achilles reflexes are 2+ on the right side and 2+ on the  left side. Speech is clear and goal oriented, follows commands Normal strength in upper and lower extremities bilaterally including dorsiflexion and plantar flexion, strong and equal grip strength Sensation normal to light and sharp touch Moves extremities without ataxia, coordination intact Normal gait Normal balance   Skin: Skin is warm and dry. No rash noted. She is not diaphoretic. No erythema.    ED Course  Procedures (including critical care time) Labs Reviewed  CBC - Abnormal; Notable for the following:    WBC 16.0 (*)    RBC 5.29 (*)    Hemoglobin 11.7 (*)    HCT 35.4 (*)    MCV 66.9 (*)    MCH 22.1 (*)    All other components within normal limits  COMPREHENSIVE METABOLIC PANEL - Abnormal; Notable for  the following:    Sodium 134 (*)    Potassium 3.0 (*)    All other components within normal limits  URINALYSIS, ROUTINE W REFLEX MICROSCOPIC - Abnormal; Notable for the following:    Ketones, ur >80 (*)    All other components within normal limits   No results found. 1. Mastitis, right, acute   2. Lumbar strain, initial encounter   3. Hypokalemia     MDM  Cyndie Mull presents with history and physical consistent with lumbar strain and mastitis.  Patient with low back pain.  No neurological deficits and normal neuro exam.  Patient can walk but states is painful.  No loss of bowel or bladder control.  No concern for cauda equina.  No fever, night sweats, weight loss, h/o cancer, IVDU.  RICE protocol and pain medicine indicated and discussed with patient. Patient physical exam with warmth, redness and mild induration of the right breast without discharge or evidence of discrete abscess for which I&D would be possible. Diagnosis of mastitis.  Patient still breast-feeding. Discussed conservative treatment including warm compresses and continuation of breast-feeding. Patient given antibiotic for infection and recommend followup with primary care physician or OB/GYN.  I have also  discussed reasons to return immediately to the ER.  Patient expresses understanding and agrees with plan.     Dahlia Client Gwyneth Fernandez, PA-C 04/28/13 0000

## 2013-04-27 NOTE — ED Notes (Signed)
Pt currently breastfeeding, having right breast pain and tenderness, occurs more when nursing her baby. Also having lower back pain. Ambulatory, no acute distress noted at triage.

## 2013-04-28 NOTE — ED Provider Notes (Signed)
Medical screening examination/treatment/procedure(s) were performed by non-physician practitioner and as supervising physician I was immediately available for consultation/collaboration.   Ashby Dawes, MD 04/28/13 [phone]

## 2013-07-16 ENCOUNTER — Emergency Department (HOSPITAL_COMMUNITY): Payer: Medicaid Other

## 2013-07-16 ENCOUNTER — Emergency Department (HOSPITAL_COMMUNITY)
Admission: EM | Admit: 2013-07-16 | Discharge: 2013-07-16 | Disposition: A | Discharge status: Home / Self Care | Payer: Self-pay | Attending: Emergency Medicine | Admitting: Emergency Medicine

## 2013-07-16 ENCOUNTER — Encounter (HOSPITAL_COMMUNITY): Payer: Self-pay | Admitting: *Deleted

## 2013-07-16 DIAGNOSIS — N83209 Unspecified ovarian cyst, unspecified side: Secondary | ICD-10-CM | POA: Insufficient documentation

## 2013-07-16 DIAGNOSIS — N83201 Unspecified ovarian cyst, right side: Secondary | ICD-10-CM

## 2013-07-16 DIAGNOSIS — K5900 Constipation, unspecified: Secondary | ICD-10-CM | POA: Insufficient documentation

## 2013-07-16 DIAGNOSIS — Z3202 Encounter for pregnancy test, result negative: Secondary | ICD-10-CM | POA: Insufficient documentation

## 2013-07-16 LAB — COMPREHENSIVE METABOLIC PANEL
ALT: 13 U/L (ref 0–35)
AST: 20 U/L (ref 0–37)
Alkaline Phosphatase: 91 U/L (ref 39–117)
CO2: 21 mEq/L (ref 19–32)
Calcium: 9 mg/dL (ref 8.4–10.5)
Chloride: 107 mEq/L (ref 96–112)
GFR calc non Af Amer: >90 mL/min (ref >90)
Potassium: 3.4 mEq/L — ABNORMAL LOW (ref 3.5–5.1)
Sodium: 141 mEq/L (ref 135–145)
Total Bilirubin: 0.2 mg/dL — ABNORMAL LOW (ref 0.3–1.2)

## 2013-07-16 LAB — CBC WITH DIFFERENTIAL/PLATELET
Basophils Relative: 1 % (ref 0–1)
Eosinophils Relative: 8 % — ABNORMAL HIGH (ref 0–5)
Hemoglobin: 10.1 g/dL — ABNORMAL LOW (ref 12.0–15.0)
Lymphocytes Relative: 43 % (ref 12–46)
Monocytes Relative: 8 % (ref 3–12)
Neutrophils Relative %: 40 % — ABNORMAL LOW (ref 43–77)
Platelets: 243 10*3/uL (ref 150–400)
RBC: 4.6 MIL/uL (ref 3.87–5.11)
WBC: 7.1 10*3/uL (ref 4.0–10.5)

## 2013-07-16 LAB — URINALYSIS, ROUTINE W REFLEX MICROSCOPIC
Glucose, UA: NEGATIVE mg/dL
Leukocytes, UA: NEGATIVE
Nitrite: NEGATIVE
Specific Gravity, Urine: 1.007 (ref 1.005–1.030)
pH: 7 (ref 5.0–8.0)

## 2013-07-16 MED ORDER — IOHEXOL 300 MG/ML  SOLN
100.0000 mL | Freq: Once | INTRAMUSCULAR | Status: AC | PRN
  Administered 2013-07-16: 100 mL via INTRAVENOUS

## 2013-07-16 MED ORDER — MORPHINE SULFATE 4 MG/ML IJ SOLN
4.0000 mg | Freq: Once | INTRAMUSCULAR | Status: DC
  Filled 2013-07-16: qty 1

## 2013-07-16 MED ORDER — IOHEXOL 300 MG/ML  SOLN
25.0000 mL | INTRAMUSCULAR | Status: AC
  Administered 2013-07-16: 25 mL via ORAL

## 2013-07-16 MED ORDER — SODIUM CHLORIDE 0.9 % IV BOLUS (SEPSIS)
1000.0000 mL | Freq: Once | INTRAVENOUS | Status: AC
  Administered 2013-07-16: 1000 mL via INTRAVENOUS

## 2013-07-16 NOTE — ED Notes (Signed)
Pt reports needing to use restroom.  Sts she has the urge but is unable to void. Dr. Jodi Mourning made aware.

## 2013-07-16 NOTE — ED Notes (Signed)
Pt tearful during assessment.  Family member at bedside interpreting.  Pt does not speak english.

## 2013-07-16 NOTE — ED Notes (Signed)
The pt says she has abd pain and she is hyperventilating.  She says she does not speak english

## 2013-07-16 NOTE — ED Provider Notes (Signed)
CSN: [id number]     Arrival date & time 07/16/13  0148 History   First MD Initiated Contact with Patient 07/16/13 0155     Chief Complaint  Patient presents with  . Abdominal Pain   (Consider location/radiation/quality/duration/timing/severity/associated sxs/prior Treatment) HPI Comments: 27 yo female non English speaking presents with RLQ pain for one day.  Translated by family member.  Pt had brief episode that resolved last week but the past 12 hrs RLQ pain worsening.  No fevers.  Mild nausea.  No vomiting.  No abd surgery or medical hx.  Constant, ache.  No vaginal sxs.  Pt breast feeding, recently finished menstrual cycle. No gb or stone hx.   Patient is a 27 y.o. female presenting with abdominal pain. The history is provided by the patient.  Abdominal Pain Pain location:  RLQ Associated symptoms: nausea   Associated symptoms: no chest pain, no chills, no dysuria, no fever, no shortness of breath, no vaginal bleeding, no vaginal discharge and no vomiting     Past Medical History  Diagnosis Date  . No pertinent past medical history   . Pregnant   . Language barrier 05/12/2012   Past Surgical History  Procedure Laterality Date  . No past surgeries     Family History  Problem Relation Age of Onset  . Other Neg Hx    History  Substance Use Topics  . Smoking status: Never Smoker   . Smokeless tobacco: Never Used  . Alcohol Use: No   OB History   Grav Para Term Preterm Abortions TAB SAB Ect Mult Living   1 1 1  0 0 0 0 0 0 1     Review of Systems  Constitutional: Negative for fever and chills.  HENT: Negative for neck pain and neck stiffness.   Eyes: Negative for visual disturbance.  Respiratory: Negative for shortness of breath.   Cardiovascular: Negative for chest pain.  Gastrointestinal: Positive for nausea and abdominal pain. Negative for vomiting and blood in stool.  Genitourinary: Negative for dysuria, flank pain, vaginal bleeding and vaginal discharge.   Musculoskeletal: Negative for back pain.  Skin: Negative for rash.  Neurological: Negative for light-headedness and headaches.    Allergies  Review of patient's allergies indicates no known allergies.  Home Medications   Current Outpatient Rx  Name  Route  Sig  Dispense  Refill  . acetaminophen (TYLENOL) 325 MG tablet   Oral   Take 650 mg by mouth every 6 (six) hours as needed for pain.         Marland Kitchen amoxicillin-clavulanate (AUGMENTIN) 875-125 MG per tablet   Oral   Take 1 tablet by mouth every 12 (twelve) hours.   14 tablet   0   . ibuprofen (ADVIL,MOTRIN) 200 MG tablet   Oral   Take 600 mg by mouth every 6 (six) hours as needed for pain.         Marland Kitchen ibuprofen (ADVIL,MOTRIN) 800 MG tablet   Oral   Take 1 tablet (800 mg total) by mouth 3 (three) times daily.   21 tablet   0    BP 126/85  Pulse 80  Temp(Src) 98.4 F (36.9 C) (Oral)  Resp 16  SpO2 99% Physical Exam  Nursing note and vitals reviewed. Constitutional: She is oriented to person, place, and time. She appears well-developed and well-nourished.  HENT:  Head: Normocephalic and atraumatic.  Eyes: Conjunctivae are normal. Right eye exhibits no discharge. Left eye exhibits no discharge.  Neck: Normal range of  motion. Neck supple. No tracheal deviation present.  Cardiovascular: Normal rate and regular rhythm.   Pulmonary/Chest: Effort normal and breath sounds normal.  Abdominal: Soft. She exhibits no distension. There is tenderness (RLQ ). There is no guarding.  Musculoskeletal: She exhibits no edema.  Neurological: She is alert and oriented to person, place, and time.  Skin: Skin is warm. No rash noted.  Psychiatric: She has a normal mood and affect.    ED Course  Procedures (including critical care time) Labs Review Labs Reviewed  CBC WITH DIFFERENTIAL - Abnormal; Notable for the following:    Hemoglobin 10.1 (*)    HCT 31.5 (*)    MCV 68.5 (*)    MCH 22.0 (*)    RDW 15.6 (*)    All other  components within normal limits  COMPREHENSIVE METABOLIC PANEL - Abnormal; Notable for the following:    Potassium 3.4 (*)    Glucose, Bld 102 (*)    Total Bilirubin 0.2 (*)    All other components within normal limits  LIPASE, BLOOD  URINALYSIS, ROUTINE W REFLEX MICROSCOPIC  PREGNANCY, URINE   Imaging Review No results found.  MDM  No diagnosis found. Clinically appy vs pyelo vs constipation. Labs, urine and CT abd pelvis. Pt wishes to hold on pain meds, breast feeding.   CT showed constipation and ovarian cyst, no pelvic pain. Fup with womens and pcp.   Pt improved in ED.   Ct Abdomen Pelvis W Contrast  07/16/2013   *RADIOLOGY REPORT*  Clinical Data: Abdominal pain, hyperventilation.  CT ABDOMEN AND PELVIS WITH CONTRAST  Technique:  Multidetector CT imaging of the abdomen and pelvis was performed following the standard protocol during bolus administration of intravenous contrast.  Contrast: OMNIPAQUE IOHEXOL 300 MG/ML  SOLN  Comparison: None available at time of study interpretation.  Findings: The included view of the lung bases are clear.  Included heart pericardium are unremarkable.  Please note, this somewhat angiographic phase and, this results in heterogeneous enhancement of the liver, which is otherwise unremarkable.  The spleen, pancreas, gallbladder and adrenal glands are unremarkable.  Stomach, small and large bowel normal in course and caliber. Please note, contrast has yet to reach the large bowel. Moderate amount of retained large bowel stool.  This small and large bowel are overall normal in course and caliber without definite wall thickening or inflammatory changes though, sensitivity decreased by overall paucity of intraperitoneal fat no intraperitoneal free fluid or free air.  The kidneys are normal.  Great vessels are normal course and caliber.  Urinary bladder is well-distended unremarkable.  3.3 cm hypodensity in the right pelvis with somewhat mild peripheral  crenulated enhancement suggesting involuting corpus luteal cyst.  Osseous structures are not suspicious.  IMPRESSION: Moderate amount of retained large bowel stool, no bowel obstruction.  3.3 cm presumed right adnexal corpus luteal cyst.   Original Report Authenticated By: Awilda Metro   DC    Enid Skeens, MD 07/16/13 [phone]

## 2013-10-13 NOTE — L&D Delivery Note (Signed)
Final Labor Progress Note At 0820 called to room, pt reports an increased in pressure and pain.  VE C/C/+3.  FHR remained reassuring with occasional variable decelerations with quick recovery to baseline.     Vaginal Delivery Note The pt utilized an epidural as pain management.   Artificial rupture of membranes today, at 0308, clear.  GBS was negaitve.  Cervical dilation was complete at  0830.  NICHD Category 2.    Pushing with guidance began at  0840.   After 2 minutes of pushing the head, shoulders and the body of a viable boy infant "[name]" delivered spontaneously with maternal effort in the ROA position at [phone]   With vigorous tone and spontaneous cry, the infant was placed on moms abd.  After the umbilical cord was clamped it was cut by the FOB, then cord blood was obtained for evaluation.  Spontaneous delivery of a intact placenta with a 3 vessel cord via Shultz at  [phone].   Episiotomy: None   The vulva, perineum, vaginal vault, rectum and cervix were inspected, no repairs needed.   Postpartum pitocin as ordered.  Fundus firm, lochia minimum, bleeding under control. EBL 150, Pt hemodynamically stable.   Sponge, laps and needle count correct and verified with the primary care nurse.  Attending MD available at all times.    Mom and baby were left in stable condition, baby skin to skin. Routine postpartum orders   Mother desires condoms for contraception   Infant to have out patient circumcision   Placenta to pathology: NO     Cord Gases sent to lab: NO Cord blood sent to lab: YES   APGARS:  8 at 1 minute and 9 at 5 minutes Weight:. 5lb 5.5oz     Kelli Griffin, CNM, MSN 09/03/2014. 9:32 AM

## 2014-02-05 ENCOUNTER — Inpatient Hospital Stay (HOSPITAL_COMMUNITY)
Admission: AD | Admit: 2014-02-05 | Discharge: 2014-02-05 | Disposition: A | Discharge status: Home / Self Care | Payer: Medicaid Other | Source: Ambulatory Visit | Attending: Obstetrics & Gynecology | Admitting: Obstetrics & Gynecology

## 2014-02-05 ENCOUNTER — Encounter (HOSPITAL_COMMUNITY): Payer: Self-pay

## 2014-02-05 DIAGNOSIS — O219 Vomiting of pregnancy, unspecified: Secondary | ICD-10-CM

## 2014-02-05 DIAGNOSIS — O210 Mild hyperemesis gravidarum: Secondary | ICD-10-CM | POA: Diagnosis not present

## 2014-02-05 LAB — URINALYSIS, ROUTINE W REFLEX MICROSCOPIC
Bilirubin Urine: NEGATIVE
Glucose, UA: NEGATIVE mg/dL
Hgb urine dipstick: NEGATIVE
KETONES UR: NEGATIVE mg/dL
LEUKOCYTES UA: NEGATIVE
NITRITE: NEGATIVE
Protein, ur: 30 mg/dL — AB
SPECIFIC GRAVITY, URINE: 1.015 (ref 1.005–1.030)
UROBILINOGEN UA: 0.2 mg/dL (ref 0.0–1.0)

## 2014-02-05 LAB — URINE MICROSCOPIC-ADD ON

## 2014-02-05 LAB — POCT PREGNANCY, URINE: Preg Test, Ur: POSITIVE — AB

## 2014-02-05 MED ORDER — PROMETHAZINE HCL 25 MG PO TABS: 25.0000 mg | ORAL_TABLET | Freq: Four times a day (QID) | ORAL | Status: DC | PRN

## 2014-02-05 NOTE — MAU Provider Note (Signed)
History     CSN: [id number]  Arrival date and time: 02/05/14 1318   First Provider Initiated Contact with Patient 02/05/14 1403      Chief Complaint  Patient presents with  . Morning Sickness  . Chills   HPI  Kelli Griffin is a 28 y.o. G2P1001 at 926w5d. She presents with c/o nausea and vomiting since her LMP 3/17. She vomits everytime she tries to eat,about 3x/d. No cramping or bleeding.  Feels cold.  OB History   Grav Para Term Preterm Abortions TAB SAB Ect Mult Living   2 1 1  0 0 0 0 0 0 1      Past Medical History  Diagnosis Date  . No pertinent past medical history   . Pregnant   . Language barrier 05/12/2012    Past Surgical History  Procedure Laterality Date  . No past surgeries      Family History  Problem Relation Age of Onset  . Other Neg Hx     History  Substance Use Topics  . Smoking status: Never Smoker   . Smokeless tobacco: Never Used  . Alcohol Use: No    Allergies: No Known Allergies  Prescriptions prior to admission  Medication Sig Dispense Refill  . Prenatal Vit-Fe Fumarate-FA (PRENATAL MULTIVITAMIN) TABS tablet Take 1 tablet by mouth daily at 12 noon.        Review of Systems  Constitutional: Positive for malaise/fatigue. Negative for fever.  Gastrointestinal: Positive for nausea and vomiting. Negative for heartburn and abdominal pain.  Genitourinary: Negative for dysuria, urgency and frequency.   Physical Exam   Blood pressure 86/53, pulse 87, temperature 98.7 F (37.1 C), resp. rate 18, last menstrual period 12/27/2013.  Physical Exam  Constitutional: She is oriented to person, place, and time. She appears well-developed and well-nourished.  Musculoskeletal: Normal range of motion.  Neurological: She is alert and oriented to person, place, and time.  Skin: Skin is warm and dry.  Psychiatric: She has a normal mood and affect. Her behavior is normal.   Results for orders placed during the hospital encounter of 02/05/14 (from the  past 24 hour(s))  URINALYSIS, ROUTINE W REFLEX MICROSCOPIC     Status: Abnormal   Collection Time    02/05/14  1:40 PM      Result Value Ref Range   Color, Urine YELLOW  YELLOW   APPearance CLEAR  CLEAR   Specific Gravity, Urine 1.015  1.005 - 1.030   pH >9.0 (*) 5.0 - 8.0   Glucose, UA NEGATIVE  NEGATIVE mg/dL   Hgb urine dipstick NEGATIVE  NEGATIVE   Bilirubin Urine NEGATIVE  NEGATIVE   Ketones, ur NEGATIVE  NEGATIVE mg/dL   Protein, ur 30 (*) NEGATIVE mg/dL   Urobilinogen, UA 0.2  0.0 - 1.0 mg/dL   Nitrite NEGATIVE  NEGATIVE   Leukocytes, UA NEGATIVE  NEGATIVE  URINE MICROSCOPIC-ADD ON     Status: Abnormal   Collection Time    02/05/14  1:40 PM      Result Value Ref Range   Squamous Epithelial / LPF FEW (*) RARE   WBC, UA 0-2  <3 WBC/hpf   RBC / HPF 0-2  <3 RBC/hpf   Bacteria, UA RARE  RARE   Urine-Other MUCOUS PRESENT    POCT PREGNANCY, URINE     Status: Abnormal   Collection Time    02/05/14  1:44 PM      Result Value Ref Range   Preg Test, Ur POSITIVE (*) NEGATIVE  MAU Course  Procedures  MDM   Assessment and Plan  5 5/[redacted] wks EGA with nausea, vomiting  Behaviors reviewed, Rx Phenergan tabs Plans [facility]    Rodell PernaKathy M Sundi Slevin 02/05/2014, 2:03 PM

## 2014-02-05 NOTE — MAU Note (Signed)
Pt presents to MAU with complaints of nausea and vomiting since she found out she was pregnant on the 17th of April. She also has had a cold with chills over the last couple of days.

## 2014-03-13 LAB — OB RESULTS CONSOLE GC/CHLAMYDIA
Chlamydia: NEGATIVE
GC PROBE AMP, GENITAL: NEGATIVE

## 2014-03-13 LAB — OB RESULTS CONSOLE HIV ANTIBODY (ROUTINE TESTING): HIV: NONREACTIVE

## 2014-03-13 LAB — OB RESULTS CONSOLE RPR: RPR: NONREACTIVE

## 2014-03-13 LAB — OB RESULTS CONSOLE HEPATITIS B SURFACE ANTIGEN: Hepatitis B Surface Ag: NEGATIVE

## 2014-03-13 LAB — OB RESULTS CONSOLE ABO/RH: RH Type: POSITIVE

## 2014-03-13 LAB — OB RESULTS CONSOLE ANTIBODY SCREEN: Antibody Screen: NEGATIVE

## 2014-03-13 LAB — OB RESULTS CONSOLE RUBELLA ANTIBODY, IGM: RUBELLA: IMMUNE

## 2014-08-14 ENCOUNTER — Encounter (HOSPITAL_COMMUNITY): Payer: Self-pay

## 2014-08-24 LAB — OB RESULTS CONSOLE GBS: GBS: NEGATIVE

## 2014-08-31 ENCOUNTER — Encounter (HOSPITAL_COMMUNITY): Payer: Self-pay | Admitting: *Deleted

## 2014-08-31 ENCOUNTER — Telehealth (HOSPITAL_COMMUNITY): Payer: Self-pay | Admitting: *Deleted

## 2014-08-31 NOTE — Telephone Encounter (Signed)
Preadmission screen Interpreter number [phone]

## 2014-09-01 ENCOUNTER — Inpatient Hospital Stay (HOSPITAL_COMMUNITY)
Admission: RE | Admit: 2014-09-01 | Discharge: 2014-09-05 | DRG: 775 | Disposition: A | Discharge status: Home / Self Care | Payer: Medicaid Other | Source: Ambulatory Visit | Attending: Obstetrics & Gynecology | Admitting: Obstetrics & Gynecology

## 2014-09-01 DIAGNOSIS — Z789 Other specified health status: Secondary | ICD-10-CM | POA: Diagnosis present

## 2014-09-01 DIAGNOSIS — IMO0002 Reserved for concepts with insufficient information to code with codable children: Secondary | ICD-10-CM | POA: Diagnosis present

## 2014-09-01 DIAGNOSIS — O365930 Maternal care for other known or suspected poor fetal growth, third trimester, not applicable or unspecified: Principal | ICD-10-CM | POA: Diagnosis present

## 2014-09-01 DIAGNOSIS — Z3A37 37 weeks gestation of pregnancy: Secondary | ICD-10-CM | POA: Diagnosis present

## 2014-09-01 DIAGNOSIS — Z603 Acculturation difficulty: Secondary | ICD-10-CM | POA: Diagnosis present

## 2014-09-01 LAB — CBC
HEMATOCRIT: 29 % — AB (ref 36.0–46.0)
Hemoglobin: 9.2 g/dL — ABNORMAL LOW (ref 12.0–15.0)
MCH: 21.5 pg — ABNORMAL LOW (ref 26.0–34.0)
MCHC: 31.7 g/dL (ref 30.0–36.0)
MCV: 67.8 fL — AB (ref 78.0–100.0)
Platelets: 238 10*3/uL (ref 150–400)
RBC: 4.28 MIL/uL (ref 3.87–5.11)
RDW: 15.1 % (ref 11.5–15.5)
WBC: 9.9 10*3/uL (ref 4.0–10.5)

## 2014-09-01 LAB — TYPE AND SCREEN
ABO/RH(D): O POS
Antibody Screen: NEGATIVE

## 2014-09-01 MED ORDER — CITRIC ACID-SODIUM CITRATE 334-500 MG/5ML PO SOLN: 30.0000 mL | ORAL | Status: DC | PRN

## 2014-09-01 MED ORDER — ZOLPIDEM TARTRATE 5 MG PO TABS: 5.0000 mg | ORAL_TABLET | Freq: Every evening | ORAL | Status: DC | PRN

## 2014-09-01 MED ORDER — MISOPROSTOL 25 MCG QUARTER TABLET
25.0000 ug | ORAL_TABLET | ORAL | Status: DC | PRN
  Administered 2014-09-01 – 2014-09-02 (×2): 25 ug via VAGINAL
  Filled 2014-09-01: qty 0.25
  Filled 2014-09-01: qty 1
  Filled 2014-09-01: qty 0.25

## 2014-09-01 MED ORDER — LACTATED RINGERS IV SOLN
500.0000 mL | INTRAVENOUS | Status: DC | PRN
  Administered 2014-09-02: 500 mL via INTRAVENOUS

## 2014-09-01 MED ORDER — OXYCODONE-ACETAMINOPHEN 5-325 MG PO TABS: 1.0000 | ORAL_TABLET | ORAL | Status: DC | PRN

## 2014-09-01 MED ORDER — OXYTOCIN BOLUS FROM INFUSION
500.0000 mL | INTRAVENOUS | Status: DC
  Administered 2014-09-03: 500 mL via INTRAVENOUS

## 2014-09-01 MED ORDER — LACTATED RINGERS IV SOLN
INTRAVENOUS | Status: DC
  Administered 2014-09-01 – 2014-09-03 (×5): via INTRAVENOUS

## 2014-09-01 MED ORDER — OXYCODONE-ACETAMINOPHEN 5-325 MG PO TABS: 2.0000 | ORAL_TABLET | ORAL | Status: DC | PRN

## 2014-09-01 MED ORDER — OXYTOCIN 40 UNITS IN LACTATED RINGERS INFUSION - SIMPLE MED
62.5000 mL/h | INTRAVENOUS | Status: DC
  Administered 2014-09-03: 62.5 mL/h via INTRAVENOUS
  Filled 2014-09-01: qty 1000

## 2014-09-01 MED ORDER — TERBUTALINE SULFATE 1 MG/ML IJ SOLN: 0.2500 mg | Freq: Once | INTRAMUSCULAR | Status: AC | PRN

## 2014-09-01 MED ORDER — ONDANSETRON HCL 4 MG/2ML IJ SOLN: 4.0000 mg | Freq: Four times a day (QID) | INTRAMUSCULAR | Status: DC | PRN

## 2014-09-01 MED ORDER — ACETAMINOPHEN 325 MG PO TABS: 650.0000 mg | ORAL_TABLET | ORAL | Status: DC | PRN

## 2014-09-01 MED ORDER — LIDOCAINE HCL (PF) 1 % IJ SOLN
30.0000 mL | INTRAMUSCULAR | Status: DC | PRN
  Filled 2014-09-01: qty 30

## 2014-09-01 MED ORDER — NALBUPHINE HCL 10 MG/ML IJ SOLN
10.0000 mg | INTRAMUSCULAR | Status: DC | PRN
  Administered 2014-09-02: 10 mg via INTRAVENOUS
  Filled 2014-09-01: qty 1

## 2014-09-01 NOTE — Progress Notes (Signed)
Kelli Griffin, [id number]   Subjective -Patient arrive for IOL due to IUGR diagnosis.  Husband at bedside acting as Nurse, learning disabilitytranslator.  Patient denies LOF, VB, Ctx, and reports +FM.  Objective Filed Vitals:   09/01/14 2012  Temp: 98 F (36.7 C)        Physical Exam  Constitutional: She is oriented to person, place, and time and well-developed, well-nourished, and in no distress.  Cardiovascular: Normal rate, regular rhythm and normal heart sounds.   Pulmonary/Chest: Effort normal and breath sounds normal.  Abdominal: Soft. Bowel sounds are normal.  Appears gravid--fundal height SGA. Soft NT   Genitourinary: Cervix exhibits no motion tenderness. No vaginal discharge found.  Musculoskeletal: Normal range of motion.  Neurological: She is alert and oriented to person, place, and time. Gait normal.  Skin: Skin is warm and dry.   WUJ:[id number]:Dilation: Closed Effacement (%): 50 Cervical Position: Middle Station: -3 Presentation: Vertex Exam by:: Osten Janek cnm  Adequate Pelvis:Proven to 6lbs 5oz Leopolds:  -Position: Vertex -EFW:   FHR: 150 bpm, Mod Var, -Decels, +Accels UC:  Irritability graphed, non palpated  Induction/Augmentation Agent: Pitocin: None Cytotec Dose: Initial at 2145 Membranes: Intact  Assessment IUP at 37.2 wks Cat I FT Bishop Score: 4 IUGR GBS Negative Language Barrier  Plan -Discussed r/b of induction including fetal distress, pain, serial induction, and increased risk of c/s -Discussed induction methods and techniques including cytotec, foley bulb, and pitocin -Questions and concerns addressed -Initial dose cytotec placed -Continue other mgmt as ordered  Kelli Griffin, CNM, MSN 9:03 PM

## 2014-09-01 NOTE — H&P (Signed)
Kelli ShihWaida Rhona RaiderBoukari is a 28 y.o. female, G2P1001 at 37.2 weeks, presenting for IOL for IUGR.  Patient with no other major complications throughout the pregnancy and is GBS Negative.  Patient is [place] speaking and requires a Nurse, learning disabilitytranslator.  Patient Active Problem List   Diagnosis Date Noted  . IUGR (intrauterine growth restriction) 09/01/2014  . Vaginal delivery 07/26/2012  . Midline episiotomy 07/26/2012  . Dating by 27 week US 05/16/2012  . Language Barrier 05/12/2012  . Late prenatal care 04/30/2012    History of present pregnancy: Patient entered care at 15.1 weeks.   EDC of 09/20/2014 was established by 15.1wk Dating US on 03/30/2014.   Anatomy scan:  19 weeks, with normal findings and an posterior placenta.   Additional US evaluations:  -34.1wks for S<D:  EFW 1970g (8%), linear growth, AFI 19.4cm, UAD: S/D 2.68 (50%). BPP 8/8. -34.6wks for BPP: BPP WNL, AFI 19.4, 70%ile. NORMAL DOPPLERS, VTX -36.1wks for Growht: 4LB 15 OZ (2240 GRAMS) 5TH%, CEPHALIC, AFI 15 CM. BPP 8/8. NORMAL S/D. DISCUSSED IUGR FINDINGS AND RISKS FROM IUGR TO FETUS AND NEONATE. RECOMMENDATION TO INDUCE AT 37WKS MADE -36.5wks for BPP: 8/8 Significant prenatal events:  Late to PNC--Arrived at 13.1wks based on LMP but 1st trimester screen revealed GA of 15.1wks. 2ND Trimester:  Occasional hip pain and experiences swelling in her feet. 3RD Trimester: C/o pain in her L thumb x 2 wks, possibily mild carpal tunnel. ANEMIC AND HAS NOT TAKEN PNV. Declines Flu shot. Poor maternal weight gain, Heartburn, IUGR found at 34wks.  Last evaluation:  08/28/2014 by Dr. Bea LauraE. Kulwa--100/60, wt 148, FHR 153, +FM  OB History    Gravida Para Term Preterm AB TAB SAB Ectopic Multiple Living   2 1 1  0 0 0 0 0 0 1     Past Medical History  Diagnosis Date  . No pertinent past medical history   . Pregnant   . Language barrier 05/12/2012   Past Surgical History  Procedure Laterality Date  . No past surgeries     Family History: family history is  negative for Other. Social History:  reports that she has never smoked. She has never used smokeless tobacco. She reports that she does not drink alcohol or use illicit drugs.   Prenatal Transfer Tool  Maternal Diabetes: No Genetic Screening: None due to Late PNC Maternal Ultrasounds/Referrals: Abnormal:  Findings:   IUGR Fetal Ultrasounds or other Referrals:  None Maternal Substance Abuse:  No Significant Maternal Medications:  Meds include: Zantac Other: Iron, Diclegis, PNV, Tylenol Significant Maternal Lab Results: Lab values include: Group B Strep negative    ROS:  No complaints as of 08/28/2014 visit  No Known Allergies     Last menstrual period 12/27/2013.  FHR: 150 bpm, Mod Var, -Decels, +Accels UCs:  Irritability graphed  Prenatal labs: ABO, Rh: O/Positive/-- (06/01 0000) Antibody: Negative (06/01 0000) Rubella:    Immune RPR: Nonreactive (06/01 0000)  HBsAg: Negative (06/01 0000)  HIV: Non-reactive (06/01 0000)  GBS: Negative (11/12 0000) Sickle cell/Hgb electrophoresis:  Normal Pap:  Normal GC:  Negative Chlamydia:  Negative Genetic screenings:  None Glucola:  Normal Other:  None    Assessment IUP at 37.2wks Cat I FT IUGR GBS Negative  [facility]: [facility] with Dr. Carmela HurtE. Kulwa Routine Labor and Delivery Orders per [facility] protocol Induction/Augmenation orders Start cytotec induction after reactive NST Reassess throughout the night  Carmichael Burdette LYNNCNM, MSN 09/01/2014, 7:54 PM

## 2014-09-02 ENCOUNTER — Encounter (HOSPITAL_COMMUNITY): Payer: Self-pay

## 2014-09-02 ENCOUNTER — Inpatient Hospital Stay (HOSPITAL_COMMUNITY): Payer: Medicaid Other | Admitting: Anesthesiology

## 2014-09-02 LAB — HIV ANTIBODY (ROUTINE TESTING W REFLEX): HIV 1&2 Ab, 4th Generation: NONREACTIVE

## 2014-09-02 LAB — RPR

## 2014-09-02 LAB — ABO/RH: ABO/RH(D): O POS

## 2014-09-02 MED ORDER — PHENYLEPHRINE 40 MCG/ML (10ML) SYRINGE FOR IV PUSH (FOR BLOOD PRESSURE SUPPORT)
80.0000 ug | PREFILLED_SYRINGE | INTRAVENOUS | Status: DC | PRN
  Filled 2014-09-02: qty 10
  Filled 2014-09-02: qty 2

## 2014-09-02 MED ORDER — DIPHENHYDRAMINE HCL 50 MG/ML IJ SOLN
12.5000 mg | INTRAMUSCULAR | Status: DC | PRN
Start: 2014-09-02 — End: 2014-09-03
  Administered 2014-09-02: 12.5 mg via INTRAVENOUS
  Filled 2014-09-02: qty 1

## 2014-09-02 MED ORDER — LACTATED RINGERS IV SOLN
500.0000 mL | Freq: Once | INTRAVENOUS | Status: AC
  Administered 2014-09-02: 500 mL via INTRAVENOUS

## 2014-09-02 MED ORDER — LIDOCAINE-EPINEPHRINE (PF) 2 %-1:200000 IJ SOLN
INTRAMUSCULAR | Status: DC | PRN
  Administered 2014-09-02: 3 mL

## 2014-09-02 MED ORDER — EPHEDRINE 5 MG/ML INJ
10.0000 mg | INTRAVENOUS | Status: DC | PRN
  Filled 2014-09-02: qty 2

## 2014-09-02 MED ORDER — FENTANYL 2.5 MCG/ML BUPIVACAINE 1/10 % EPIDURAL INFUSION (WH - ANES)
INTRAMUSCULAR | Status: DC | PRN
  Administered 2014-09-02: 12 mL/h via EPIDURAL

## 2014-09-02 MED ORDER — OXYTOCIN 40 UNITS IN LACTATED RINGERS INFUSION - SIMPLE MED
1.0000 m[IU]/min | INTRAVENOUS | Status: DC
  Administered 2014-09-02: 8 m[IU]/min via INTRAVENOUS

## 2014-09-02 MED ORDER — FENTANYL 2.5 MCG/ML BUPIVACAINE 1/10 % EPIDURAL INFUSION (WH - ANES)
14.0000 mL/h | INTRAMUSCULAR | Status: DC | PRN
  Administered 2014-09-02 – 2014-09-03 (×2): 14 mL/h via EPIDURAL
  Filled 2014-09-02 (×2): qty 125

## 2014-09-02 MED ORDER — PHENYLEPHRINE 40 MCG/ML (10ML) SYRINGE FOR IV PUSH (FOR BLOOD PRESSURE SUPPORT)
80.0000 ug | PREFILLED_SYRINGE | INTRAVENOUS | Status: DC | PRN
  Filled 2014-09-02: qty 2

## 2014-09-02 MED ORDER — TERBUTALINE SULFATE 1 MG/ML IJ SOLN: 0.2500 mg | Freq: Once | INTRAMUSCULAR | Status: AC | PRN

## 2014-09-02 MED ORDER — OXYTOCIN 40 UNITS IN LACTATED RINGERS INFUSION - SIMPLE MED
1.0000 m[IU]/min | INTRAVENOUS | Status: DC
  Administered 2014-09-02: 1 m[IU]/min via INTRAVENOUS
  Filled 2014-09-02: qty 1000

## 2014-09-02 MED ORDER — BUPIVACAINE HCL (PF) 0.25 % IJ SOLN
INTRAMUSCULAR | Status: DC | PRN
  Administered 2014-09-02 (×2): 4 mL via EPIDURAL

## 2014-09-02 NOTE — Progress Notes (Signed)
Kelli MullWaida Barman MRN: [id number]  Subjective: -Patient resting in bed.  Reports nubain was effective. Husband remains at bedside.   Objective: BP 91/59 mmHg  Pulse 92  Temp(Src) 98.1 F (36.7 C) (Oral)  Resp 16  Ht 5\' 1"  (1.549 m)  Wt 148 lb (67.132 kg)  BMI 27.98 kg/m2  LMP 12/27/2013     FHT: 135 bpm, Mod Var, -Decels, +Accels UC: Irregular, graphs q 1-995min, palpates moderate   SVE:   Dilation: 2 Effacement (%): 50 Station: -3 Exam by:: Shanda Bumpsjessica cnm Membranes:Intact Pitocin: Initiated Cytotec: S/P 2 Doses  Assessment:  IUP at 37.3wks Cat I FT  IUGR Irregular Contractions Bishop Score: 5  Plan: -Initiate pitocin to regulate contraction pattern -Okay for epidural if desired -Continue other mgmt as ordered -Dr. Carmela HurtE. Kulwa updated on patient status  Mykah Bellomo LYNN,CNM, MSN 09/02/2014, 6:42 AM

## 2014-09-02 NOTE — Progress Notes (Signed)
Cyndie MullWaida Petit MRN: [id number]  Subjective: -Strip Reviewed.  Nurse call reports patient requesting pain medication.  Objective: BP 104/58 mmHg  Pulse 84  Temp(Src) 98.1 F (36.7 C) (Oral)  Resp 18  Ht 5\' 1"  (1.549 m)  Wt 148 lb (67.132 kg)  BMI 27.98 kg/m2  LMP 12/27/2013     FHT: 145 bpm, Mod Var, -Decels, +Accels UC:   Irritability graphed SVE: Deferred Membranes: Intact Cytotec:2nd dose at 0045  Assessment:  IUP at 37.3wks Cat I FT  Cervical Ripening Pain  Plan: -Okay for IV Pain medication -Will continue to monitor -Continue other mgmt as ordered  [facility] Hospital[facility], Tikita Mabee LYNN,CNM, MSN 09/02/2014, 3:19 AM

## 2014-09-02 NOTE — Progress Notes (Signed)
Labor Progress  Subjective: Comfortable with epidural  Objective: BP 116/63 mmHg  Pulse 76  Temp(Src) 98.3 F (36.8 C) (Oral)  Resp 20  Ht 5\' 1"  (1.549 m)  Wt 148 lb (67.132 kg)  BMI 27.98 kg/m2  SpO2 100%  LMP 12/27/2013     FHT: 145, moderate variability, + accel, occasional variabile CTX:  regular, every 2-5 minutes Uterus gravid, soft non tender SVE:  Dilation: 2 Effacement (%): 50 Station: -3 Exam by:: Shepard Keltz CNM Pitocin at 3214mUn/min  Assessment:  IUP at 37.2 weeks NICHD: Category 2 Membranes: intact Labor progress: Inadquate labor progress Pitocin Augmentation GBS: negative    Plan: Continue labor plan Continuousmonitoring Rest Frequent position changes to facilitate fetal rotation and descent. Will reassess with cervical exam at 1900 or earlier if necessary Continue pitocin per protocol Considering foley bulb at next check if no change    Kelli Griffin, CNM, MSN 09/02/2014. 4:29 PM

## 2014-09-02 NOTE — Progress Notes (Signed)
Labor Progress  Subjective: Visiting with family  Objective: BP 119/69 mmHg  Pulse 82  Temp(Src) 98.3 F (36.8 C) (Oral)  Resp 20  Ht 5\' 1"  (1.549 m)  Wt 148 lb (67.132 kg)  BMI 27.98 kg/m2  SpO2 100%  LMP 12/27/2013    FHT: 140, moderate variability, + accel, no decels CTX:  regular, every 2-5 minutes Uterus gravid, soft non tender SVE:  Dilation: 2 Effacement (%): 50 Station: -3 Exam by:: V. Kamrie Fanton CNM Pitocin at 1314mUn/min  Assessment:  IUP at 37.3 weeks NICHD: Category 1 Membranes:  intact Labor progress: Inadquate labor progress Pitocin Augmentation GBS: negative Foley bulb placed at [id number]   Plan: Continue labor plan Continuous monitoring Rest Frequent position changes to facilitate fetal rotation and descent. Continue pitocin per protocol     Arriana Lohmann, CNM, MSN 09/02/2014. 5:28 PM

## 2014-09-02 NOTE — Progress Notes (Signed)
Labor Progress  Subjective: Feeling ctx lightly, difficult with VE  Objective: BP 105/59 mmHg  Pulse 75  Temp(Src) 98.2 F (36.8 C) (Oral)  Resp 18  Ht 5\' 1"  (1.549 m)  Wt 148 lb (67.132 kg)  BMI 27.98 kg/m2  LMP 12/27/2013     FHT: 135, moderate variability, + accel CTX:  dysfunctional Uterus gravid, soft non tender SVE:  Dilation: 2 Effacement (%): 50 Station: -3 Exam by:: Jabir Dahlem Pitocin at 915mUn/min  Assessment:  IUP at 37.3 weeks IOL for IUGR NICHD: Category Membranes:  intact Labor progress: slow progress Pitocin Augmentation GBS: negative   Plan: Continue labor plan Continuousmonitoring Rest Frequent position changes to facilitate fetal rotation and descent. Will reassess with cervical exam at 1400 or earlier if necessary Continue pitocin per protocol 2x2     Kelli Griffin, CNM, MSN 09/02/2014. 10:27 AM

## 2014-09-02 NOTE — Anesthesia Preprocedure Evaluation (Signed)
Anesthesia Evaluation  Patient identified by MRN, date of birth, ID band Patient awake    Reviewed: Allergy & Precautions, H&P , NPO status , Patient's Chart, lab work & pertinent test results  History of Anesthesia Complications Negative for: history of anesthetic complications  Airway Mallampati: II  TM Distance: >3 FB Neck ROM: full    Dental no notable dental hx.    Pulmonary neg pulmonary ROS,  breath sounds clear to auscultation  Pulmonary exam normal       Cardiovascular negative cardio ROS  Rhythm:Regular     Neuro/Psych negative neurological ROS  negative psych ROS   GI/Hepatic negative GI ROS, Neg liver ROS,   Endo/Other  negative endocrine ROS  Renal/GU negative Renal ROS  negative genitourinary   Musculoskeletal negative musculoskeletal ROS (+)   Abdominal Normal abdominal exam  (+)   Peds negative pediatric ROS (+)  Hematology negative hematology ROS (+)   Anesthesia Other Findings   Reproductive/Obstetrics (+) Pregnancy                             Anesthesia Physical Anesthesia Plan  ASA: II  Anesthesia Plan: Epidural   Post-op Pain Management:    Induction:   Airway Management Planned:   Additional Equipment:   Intra-op Plan:   Post-operative Plan:   Informed Consent: I have reviewed the patients History and Physical, chart, labs and discussed the procedure including the risks, benefits and alternatives for the proposed anesthesia with the patient or authorized representative who has indicated his/her understanding and acceptance.   Dental advisory given  Plan Discussed with: Anesthesiologist  Anesthesia Plan Comments:         Anesthesia Quick Evaluation

## 2014-09-02 NOTE — Progress Notes (Signed)
Labor Progress  Subjective: Pt sleeping  Objective: BP 112/80 mmHg  Pulse 80  Temp(Src) 98.2 F (36.8 C) (Oral)  Resp 18  Ht 5\' 1"  (1.549 m)  Wt 148 lb (67.132 kg)  BMI 27.98 kg/m2  LMP 12/27/2013     FHT: 135, moderate variability, + accel, no decels CTX:  irregular, every 2-6 minutes Uterus gravid, soft non tender SVE:  Dilation: 2 Effacement (%): 50 Station: -3 Exam by:: jessica cnm at 0600 Pitocin at 32mUn/min  Assessment:  IUP at 37.3 weeks NICHD: Category 1 Membranes: intact Labor progress: early labor Pitocin Augmentation WUJ:[id number]:negative   Plan: Continue labor plan Continuousmonitoring Rest Frequent position changes to facilitate fetal rotation and descent. Will reassess with cervical exam at 1000 or earlier if necessary  Continue pitocin per protocol Amnioinfusion with bolus of 300 cc, then 150 cc/hr.     Melannie Metzner, CNM, MSN 09/02/2014. 8:17 AM

## 2014-09-02 NOTE — Progress Notes (Signed)
Kelli MullWaida Griffin MRN: [id number]  Subjective: -Care assumed.  In to assess.  Husband at bedside.  Patient asleep.  Did not attempt to awake.  Will return at later time.   Objective: BP 110/63 mmHg  Pulse 88  Temp(Src) 98.2 F (36.8 C) (Oral)  Resp 18  Ht 5\' 1"  (1.549 m)  Wt 148 lb (67.132 kg)  BMI 27.98 kg/m2  SpO2 100%  LMP 12/27/2013   Total I/O In: -  Out: 600 [Urine:600] FHT:145  bpm, Mod Var, -Decels, +Accels UC:  Graphed Q 1-534min SVE:   Dilation: 2 Effacement (%): 50 Station: -3 Exam by:: V. Standard CNM Membranes: Intact Foley Bulb placed at 1720 Pitocin:1418mUn/min  Assessment:  IUP at 37.3wks Cat I FT  IOL IUGR  Plan: -Will return to assess patient comfort  -Continue other mgmt as ordered  Kelli Griffin, Kelli Griffin,CNM, MSN 09/02/2014, 8:37 PM

## 2014-09-02 NOTE — Progress Notes (Signed)
Kelli MullWaida Griffin MRN: [id number]  Subjective: -Patient reports some cramping and discomfort with contractions.  States 5/10. Declines pain medication and sleep aide.   Objective: BP 106/60 mmHg  Pulse 98  Temp(Src) 98 F (36.7 C) (Oral)  Resp 18  Ht 5\' 1"  (1.549 m)  Wt 148 lb (67.132 kg)  BMI 27.98 kg/m2  LMP 12/27/2013     FHT: 145 bpm, Mod Var, -Decels, +Accels UC:   Graphed Q2-615min, palpates mild SVE:   Dilation: Closed Effacement (%): 50 Station: -3 Exam by:: Shineka Auble cnm  Bloody Show Noted Membranes:Intact Cytotec:2nd Dose at 0045  Assessment:  IUP at 37.3wks Cat I FT  Cervical Ripening IUGR  Plan: -2nd dose cytotec placed -Discussed SVE findings and bloody show noted after placement of cytotec -No questions and/or concerns -Will continue to monitor -Continue other mgmt as ordered  [facility] Center[facility]LY, Lynette Topete LYNN,CNM, MSN 09/02/2014, 12:52 AM

## 2014-09-02 NOTE — Anesthesia Procedure Notes (Signed)
Epidural Patient location during procedure: OB  Staffing Anesthesiologist: Mabell Esguerra, CHRIS Performed by: anesthesiologist   Preanesthetic Checklist Completed: patient identified, surgical consent, pre-op evaluation, timeout performed, IV checked, risks and benefits discussed and monitors and equipment checked  Epidural Patient position: sitting Prep: site prepped and draped and DuraPrep Patient monitoring: heart rate, cardiac monitor, continuous pulse ox and blood pressure Approach: midline Location: L3-L4 Injection technique: LOR saline  Needle:  Needle type: Tuohy  Needle gauge: 17 G Needle length: 9 cm Needle insertion depth: 5 cm Catheter type: closed end flexible Catheter size: 19 Gauge Catheter at skin depth: 12 cm Test dose: negative and 2% lidocaine with Epi 1:200 K  Assessment Events: blood not aspirated, injection not painful, no injection resistance, negative IV test and no paresthesia  Additional Notes H+P and labs checked, risks and benefits discussed with the patient, consent obtained, procedure tolerated well and without complications.  Reason for block:procedure for pain   

## 2014-09-03 ENCOUNTER — Encounter (HOSPITAL_COMMUNITY): Payer: Self-pay

## 2014-09-03 MED ORDER — OXYCODONE-ACETAMINOPHEN 5-325 MG PO TABS
1.0000 | ORAL_TABLET | ORAL | Status: DC | PRN
  Administered 2014-09-03 (×2): 1 via ORAL
  Filled 2014-09-03 (×2): qty 1

## 2014-09-03 MED ORDER — PRENATAL MULTIVITAMIN CH
1.0000 | ORAL_TABLET | Freq: Every day | ORAL | Status: DC
  Administered 2014-09-03 – 2014-09-05 (×3): 1 via ORAL
  Filled 2014-09-03 (×3): qty 1

## 2014-09-03 MED ORDER — OXYCODONE-ACETAMINOPHEN 5-325 MG PO TABS: 2.0000 | ORAL_TABLET | ORAL | Status: DC | PRN

## 2014-09-03 MED ORDER — TETANUS-DIPHTH-ACELL PERTUSSIS 5-2.5-18.5 LF-MCG/0.5 IM SUSP: 0.5000 mL | Freq: Once | INTRAMUSCULAR | Status: DC

## 2014-09-03 MED ORDER — SIMETHICONE 80 MG PO CHEW: 80.0000 mg | CHEWABLE_TABLET | ORAL | Status: DC | PRN

## 2014-09-03 MED ORDER — IBUPROFEN 600 MG PO TABS
600.0000 mg | ORAL_TABLET | Freq: Four times a day (QID) | ORAL | Status: DC
  Administered 2014-09-03 – 2014-09-05 (×9): 600 mg via ORAL
  Filled 2014-09-03 (×9): qty 1

## 2014-09-03 MED ORDER — LANOLIN HYDROUS EX OINT: TOPICAL_OINTMENT | CUTANEOUS | Status: DC | PRN

## 2014-09-03 MED ORDER — SENNOSIDES-DOCUSATE SODIUM 8.6-50 MG PO TABS
2.0000 | ORAL_TABLET | ORAL | Status: DC
  Administered 2014-09-04 (×2): 2 via ORAL
  Filled 2014-09-03 (×2): qty 2

## 2014-09-03 MED ORDER — DIPHENHYDRAMINE HCL 25 MG PO CAPS: 25.0000 mg | ORAL_CAPSULE | Freq: Four times a day (QID) | ORAL | Status: DC | PRN

## 2014-09-03 MED ORDER — OXYTOCIN 40 UNITS IN LACTATED RINGERS INFUSION - SIMPLE MED: 1.0000 m[IU]/min | INTRAVENOUS | Status: DC

## 2014-09-03 MED ORDER — DIBUCAINE 1 % RE OINT: 1.0000 "application " | TOPICAL_OINTMENT | RECTAL | Status: DC | PRN

## 2014-09-03 MED ORDER — ONDANSETRON HCL 4 MG/2ML IJ SOLN: 4.0000 mg | INTRAMUSCULAR | Status: DC | PRN

## 2014-09-03 MED ORDER — BENZOCAINE-MENTHOL 20-0.5 % EX AERO: 1.0000 "application " | INHALATION_SPRAY | CUTANEOUS | Status: DC | PRN

## 2014-09-03 MED ORDER — ONDANSETRON HCL 4 MG PO TABS: 4.0000 mg | ORAL_TABLET | ORAL | Status: DC | PRN

## 2014-09-03 MED ORDER — WITCH HAZEL-GLYCERIN EX PADS: 1.0000 "application " | MEDICATED_PAD | CUTANEOUS | Status: DC | PRN

## 2014-09-03 MED ORDER — ZOLPIDEM TARTRATE 5 MG PO TABS: 5.0000 mg | ORAL_TABLET | Freq: Every evening | ORAL | Status: DC | PRN

## 2014-09-03 NOTE — Progress Notes (Signed)
Cyndie MullWaida Haack MRN: [id number]  Subjective: -Patient reports some back pain.   Objective: BP 103/64 mmHg  Pulse 90  Temp(Src) 98.6 F (37 C) (Oral)  Resp 18  Ht 5\' 1"  (1.549 m)  Wt 148 lb (67.132 kg)  BMI 27.98 kg/m2  SpO2 100%  LMP 12/27/2013   Total I/O In: -  Out: 800 [Urine:800] FHT: 150 bpm, Mod Var, -Decels, +10x10 Accels UC:  Q814min, palpates moderate MVUs 70  SVE:   Dilation: 5 Effacement (%): 90 Station: -2 Exam by:: Gerrit HeckJessica Tyreik Delahoussaye, CNM Membranes: AROM at 0300 Pitocin: Initiate at 612mUn/min  Assessment:  IUP at 37.4wks Cat I FT  IUGR Pitocin IOL  Plan: -Inadequate contractions -Will restart pitocin -Continue other mgmt as ordered  Juanetta Negash LYNN,CNM, MSN 09/03/2014, 5:20 AM

## 2014-09-03 NOTE — Plan of Care (Signed)
Problem: Consults Goal: Postpartum Patient Education (See Patient Education module for education specifics.) Outcome: Progressing  Problem: Phase I Progression Outcomes Goal: Pain controlled with appropriate interventions Outcome: Completed/Met Date Met:  09/03/14 Goal: VS, stable, temp < 100.4 degrees F Outcome: Completed/Met Date Met:  09/03/14 Goal: Initial discharge plan identified Outcome: Completed/Met Date Met:  09/03/14

## 2014-09-03 NOTE — Plan of Care (Signed)
Problem: Phase I Progression Outcomes Goal: Voiding adequately Outcome: Completed/Met Date Met:  09/03/14 Goal: OOB as tolerated unless otherwise ordered Outcome: Completed/Met Date Met:  09/03/14  Problem: Phase II Progression Outcomes Goal: Pain controlled on oral analgesia Outcome: Completed/Met Date Met:  09/03/14 Goal: Progress activity as tolerated unless otherwise ordered Outcome: Completed/Met Date Met:  09/03/14 Goal: Afebrile, VS remain stable Outcome: Completed/Met Date Met:  09/03/14 Goal: Tolerating diet Outcome: Completed/Met Date Met:  09/03/14

## 2014-09-03 NOTE — Anesthesia Postprocedure Evaluation (Signed)
  Anesthesia Post-op Note  Patient: Kelli Griffin  Procedure(s) Performed: * No procedures listed *  Patient Location: Mother/Baby  Anesthesia Type:Epidural  Level of Consciousness: awake  Airway and Oxygen Therapy: Patient Spontanous Breathing  Post-op Pain: mild  Post-op Assessment: Patient's Cardiovascular Status Stable and Respiratory Function Stable  Post-op Vital Signs: stable  Last Vitals:  Filed Vitals:   09/03/14 1244  BP: 111/72  Pulse: 110  Temp: 36.6 C  Resp: 18    Complications: No apparent anesthesia complications

## 2014-09-03 NOTE — Progress Notes (Signed)
Called CorvallisJessica Griffin, CNM, reported uterus not relaxing.  Order to turn pitocin off, let patient rest. She see patient to  evaluate again a little later..Marland Kitchen

## 2014-09-03 NOTE — Progress Notes (Addendum)
Kelli Griffin MRN: [id number]  Subjective: -Patient asleep.  Husband at bedside and expresses concern about patient still being pregnant.  Objective: BP 104/65 mmHg  Pulse 83  Temp(Src) 98.6 F (37 C) (Oral)  Resp 18  Ht 5\' 1"  (1.549 m)  Wt 148 lb (67.132 kg)  BMI 27.98 kg/m2  SpO2 100%  LMP 12/27/2013   Total I/O In: -  Out: 800 [Urine:800] FHT:150  bpm, Mod Var, -Decels, -Accels UC:  Q1-935min, palpates moderate SVE:   Dilation: 5 Effacement (%): 70 Station: -3 Exam by:: Kelli Griffin Membranes:AROM at 0300 Pitocin:3424mUn/min IUPC inserted  Assessment:  IUP at 37.3 Cat I FT  IUGR Pitocin IOL Amniotomy  Plan: -Reassurances given regarding lack of birth, educated on prematurity of infant and IUGR diagnosis -Amniotomy performed and IUPC inserted -Continue other mgmt as ordered -Will reassess and update attending prn  Kelli Griffin,CNM, MSN 09/03/2014, 3:10 AM   Addendum: [id number]  S: Nurse call stating patient not experiencing resting tone between contractions.   O: FHR: 150 bpm, Mod Var, -Decels, -Accels UC: Qmin, unable to assess MVUs  A: Tachysystole Pitocin IOL  P: Instructed to d/c pitocin for one hour and provider will be in to reassess at that time.  Continue other mgmt as ordered  Kelli Griffin, CNM, MSN 09/03/2014 3:26 AM

## 2014-09-03 NOTE — Lactation Note (Signed)
This note was copied from the chart of Kelli Griffin. Lactation Consultation Note  Patient Name: Kelli Griffin ZOXWR'UToday's Date: 09/03/2014 Reason for consult: Initial assessment;Infant < 6lbs;Late preterm infant (37 weeks and 4 days) Mom is a second-time, experienced breastfeeding mother.  She speaks [place] and FOB translates.  Mom breastfed first child for 1 1/2 yearsnd LC observed this newborn well-latched and with strong sucking bursts.  baby is an early term infant but weight is less than 6 pounds.  LC provides and reviews LPI handout and discusses cue feeding at least every 3 hours, hand expression which mom states she knows how to do and frequent STS, as well as special needs of early term infant. Mom encouraged to feed baby 8-12 times/24 hours and with feeding cues. LC encouraged review of Baby and Me pp 9, 14 and 20-25 for STS and BF information. LC provided [employer] Resource brochure and reviewed [facility] and list of community and web site resources, including the llli website which includes breastfeeding information in [place] language     Maternal Data Formula Feeding for Exclusion: No Has patient been taught Hand Expression?: Yes (mom informs LC (FOB translates) she states she knows how to hand express) Does the patient have breastfeeding experience prior to this delivery?: Yes  Feeding    LATCH Score/Interventions            Most recent LATCH score=9 per RN assessment; LC observed baby already latched during visit          Lactation Tools Discussed/Used   STS, cue feedings (minimum of q3h), hand expression LPI handout and needs of late preterm/early term infants  Consult Status Consult Status: Follow-up Date: 09/04/14 Follow-up type: In-patient    Warrick ParisianBryant, Pearley Baranek [facility] 09/03/2014, 9:24 PM

## 2014-09-03 NOTE — Progress Notes (Signed)
Cyndie MullWaida Danford MRN: [id number]  Subjective: -Foley bulb out at 2230.  Patient remains comfortable and asleep.  Able to awaken with touch.   Objective: BP 98/59 mmHg  Pulse 79  Temp(Src) 98.3 F (36.8 C) (Oral)  Resp 18  Ht 5\' 1"  (1.549 m)  Wt 148 lb (67.132 kg)  BMI 27.98 kg/m2  SpO2 100%  LMP 12/27/2013   Total I/O In: -  Out: 800 [Urine:800] FHT: 145 bpm, Mod Var, -Decels, -Accels UC:   Q1-224min, palpates moderate SVE:   Dilation: 5 Effacement (%): 70 Station: -3, Ballotable Bloody Show Exam by:: J.Armella Stogner, CNM Membranes: Intact Pitocin: 2920mUn/min  Assessment:  IUP at 37.4wks Cat I FT  Pitocin IOL IUGR  Plan: -SVE discussed with patient and husband -Position change -Continue titration of pitocin -Continue other mgmt as ordered  Royann Wildasin LYNN,CNM, MSN 09/03/2014, 12:50 AM

## 2014-09-03 NOTE — Plan of Care (Signed)
Problem: Phase I Progression Outcomes Goal: Assess per MD/Nurse,Routine-VS,FHR,UC,Head to Toe assess Outcome: Completed/Met Date Met:  09/03/14 Goal: Obtain and review prenatal records Outcome: Completed/Met Date Met:  09/03/14 Goal: Pain controlled with appropriate interventions Outcome: Completed/Met Date Met:  09/03/14 Goal: OOB as tolerated unless otherwise ordered Outcome: Completed/Met Date Met:  09/03/14 Goal: Tolerating diet Outcome: Completed/Met Date Met:  09/03/14 Goal: Medications/IV Fluids N/A Outcome: Completed/Met Date Met:  09/03/14 Goal: Induction meds as ordered Outcome: Completed/Met Date Met:  09/03/14 Goal: Pitocin as ordered Outcome: Completed/Met Date Met:  09/03/14 Goal: FHR checked 5 minutes after meds (ROM) Rupture of Membranes Outcome: Completed/Met Date Met:  09/03/14 Goal: Assess/evaluate labor progress and adequacy Outcome: Completed/Met Date Met:  09/03/14 Goal: Assess/evaluate cervical exam prn (q2hrs in active phase) Outcome: Not Applicable Date Met:  83/66/29 Midwife prefers to check patient herself.   Goal: Discharge home if all goals are met Outcome: Not Applicable Date Met:  47/65/46 Goal: Other Phase I Outcomes/Goals Outcome: Not Applicable Date Met:  50/35/46

## 2014-09-04 LAB — CBC
HEMATOCRIT: 24.7 % — AB (ref 36.0–46.0)
Hemoglobin: 7.8 g/dL — ABNORMAL LOW (ref 12.0–15.0)
MCH: 21.4 pg — ABNORMAL LOW (ref 26.0–34.0)
MCHC: 31.6 g/dL (ref 30.0–36.0)
MCV: 67.7 fL — ABNORMAL LOW (ref 78.0–100.0)
Platelets: 180 10*3/uL (ref 150–400)
RBC: 3.65 MIL/uL — ABNORMAL LOW (ref 3.87–5.11)
RDW: 15.2 % (ref 11.5–15.5)
WBC: 12.9 10*3/uL — AB (ref 4.0–10.5)

## 2014-09-04 MED ORDER — FERROUS SULFATE 325 (65 FE) MG PO TABS
325.0000 mg | ORAL_TABLET | Freq: Two times a day (BID) | ORAL | Status: DC
  Administered 2014-09-04 – 2014-09-05 (×3): 325 mg via ORAL
  Filled 2014-09-04 (×3): qty 1

## 2014-09-04 NOTE — Progress Notes (Addendum)
Subjective: Postpartum Day 1: Vaginal delivery, no laceration Patient up ad lib, reports no syncope or dizziness. Feeding: Breast Contraceptive plan:  Condoms  Objective: Vital signs in last 24 hours: Temp:  [97.5 F (36.4 C)-99 F (37.2 C)] 98.2 F (36.8 C) (11/23 0543) Pulse Rate:  [69-115] 81 (11/23 0827) Resp:  [18] 18 (11/23 0543) BP: (95-118)/(59-89) 95/68 mmHg (11/23 0827) SpO2:  [99 %-100 %] 99 % (11/23 0827)   Orthostatics stable  Physical Exam:  General: alert Lochia: appropriate Uterine Fundus: firm Perineum: Perineum intact DVT Evaluation: No evidence of DVT seen on physical exam. Negative Homan's sign.    Recent Labs  09/01/14 2030 09/04/14 0535  HGB 9.2* 7.8*  HCT 29.0* 24.7*    Assessment/Plan: Status post vaginal delivery day 1. Anemia without hemodynamic instability French-speaking Stable Continue current care. Plan for discharge tomorrow  Patient's husband will not be with patient tomorrow due to work schedule--will need to use French-speaking telephone interpreter for d/c instructions review. Fe BID. CBC tomorrow.    Kelli Griffin, VICKICNM 09/04/2014, 9:22 AM

## 2014-09-04 NOTE — Progress Notes (Signed)
Received report from Night RN, patient asleep holding baby.  Placed baby in bassinet, FOB at bedside asleep.

## 2014-09-04 NOTE — Discharge Summary (Signed)
Vaginal Delivery Discharge Summary  ALL information will be verified prior to discharge  Kelli Griffin  DOB:    September 03, 1986 MRN:    [id number] CSN:    [id number]  Date of admission:                  09/01/14  Date of discharge:                   09/05/14  Procedures this admission: SVD  Date of Delivery: 09/03/14  Newborn Data:  Live born  Information for the patient's newborn:  Sabriah, Hobbins [[id number]]  female  Live born female  Birth Weight: 5 lb 5.5 oz (2424 g) APGAR: 8, 9   Home with mother. Name: [facility] Circumcision Plan: condoms  History of Present Illness: Ms. Kelli Griffin is a 28 y.o. female, G2P2002, who presents at [redacted]w[redacted]d weeks gestation. The patient has been followed at the [facility] Gynecology division of [facility]. She was admitted induction of labor. Her pregnancy has been complicated by:  Patient Active Problem List   Diagnosis Date Noted  . IUGR (intrauterine growth restriction) 09/01/2014  . Vaginal delivery 07/26/2012  . Midline episiotomy 07/26/2012  . Dating by 27 week [place] 05/16/2012  . Language barrier 05/12/2012    Hospital course: The patient was admitted for IOL for IUGR.   Her labor was not complicated. She proceeded to have a vaginal delivery of a healthy infant. Her delivery was not complicated. Her postpartum course was not complicated. She was discharged to home on postpartum day 2 doing well.  Feeding: breast  Contraception: condoms Pt understands the risks birth control are not limited to irregular bleeding, formation of DVT, fluid fluctuations, elevation in blood pressure, stroke, breast tenderness and liver damage.  She states she will report any serious side effects.  She has been given verbal and written instructions and voiced a clear understanding.    Discharge hemoglobin: HEMOGLOBIN  Date Value Ref Range Status  09/05/2014 8.1* 12.0 - 15.0 g/dL Final   HCT  Date Value Ref Range  Status  09/05/2014 25.8* 36.0 - 46.0 % Final    PreNatal Labs ABO, Rh: --/--/O POS, O POS (11/20 2030)   Antibody: NEG (11/20 2030) Rubella:   immune RPR: NON REAC (11/20 2117)  HBsAg: Negative (06/01 0000)  HIV: NONREACTIVE (11/20 2117)  GBS: Negative (11/12 0000)  Discharge Physical Exam:  General: alert and cooperative, denies HA, SOB, dizziness, weakness Lochia: appropriate Uterine Fundus: firm Incision: healing well DVT Evaluation: No evidence of DVT seen on physical exam.  Intrapartum Procedures: spontaneous vaginal delivery Postpartum Procedures: none Complications-Operative and Postpartum: none  Discharge Diagnoses: Term Pregnancy-delivered, anemia -stable  Activity:           pelvic rest Diet:                routine Medications: PNV, Ibuprofen, Iron and Percocet Condition:      stable     Postpartum Teaching: Nutrition, exercise, return to work or school, family visits, sexual activity, home rest, vaginal bleeding, pelvic rest, family planning, s/s of PPD, breast care and peri-care   Discharge to: home  Follow-up Information    Follow up with [facility]. Schedule an appointment as soon as possible for a visit in 6 weeks.   Specialty:  Obstetrics and Gynecology   Why:  Postpartum check up   Contact information:   [address]  [place] [phone]       Roi Jafari, CNM, MSN 09/05/2014. 7:00 AM   Postpartum Care After Vaginal Delivery  After you deliver your newborn (postpartum period), the usual stay in the hospital is 24 72 hours. If there were problems with your labor or delivery, or if you have other medical problems, you might be in the hospital longer.  While you are in the hospital, you will receive help and instructions on how to care for yourself and your newborn during the postpartum period.  While you are in the hospital:  Be sure to tell your nurses if you have  pain or discomfort, as well as where you feel the pain and what makes the pain worse.  If you had an incision made near your vagina (episiotomy) or if you had some tearing during delivery, the nurses may put ice packs on your episiotomy or tear. The ice packs may help to reduce the pain and swelling.  If you are breastfeeding, you may feel uncomfortable contractions of your uterus for a couple of weeks. This is normal. The contractions help your uterus get back to normal size.  It is normal to have some bleeding after delivery.  For the first 1 3 days after delivery, the flow is red and the amount may be similar to a period.  It is common for the flow to start and stop.  In the first few days, you may pass some small clots. Let your nurses know if you begin to pass large clots or your flow increases.  Do not  flush blood clots down the toilet before having the nurse look at them.  During the next 3 10 days after delivery, your flow should become more watery and pink or brown-tinged in color.  Ten to fourteen days after delivery, your flow should be a small amount of yellowish-white discharge.  The amount of your flow will decrease over the first few weeks after delivery. Your flow may stop in 6 8 weeks. Most women have had their flow stop by 12 weeks after delivery.  You should change your sanitary pads frequently.  Wash your hands thoroughly with soap and water for at least 20 seconds after changing pads, using the toilet, or before holding or feeding your newborn.  You should feel like you need to empty your bladder within the first 6 8 hours after delivery.  In case you become weak, lightheaded, or faint, call your nurse before you get out of bed for the first time and before you take a shower for the first time.  Within the first few days after delivery, your breasts may begin to feel tender and full. This is called engorgement. Breast tenderness usually goes away within 48 72 hours  after engorgement occurs. You may also notice milk leaking from your breasts. If you are not breastfeeding, do not stimulate your breasts. Breast stimulation can make your breasts produce more milk.  Spending as much time as possible with your newborn is very important. During this time, you and your newborn can feel close and get to know each other. Having your newborn stay in your room (rooming in) will help to strengthen the bond with your newborn. It will give you time to get to know your newborn and become comfortable caring for your newborn.  Your hormones change after delivery. Sometimes the hormone changes can temporarily cause you to feel sad or tearful. These feelings should not last more than a few days. If  these feelings last longer than that, you should talk to your caregiver.  If desired, talk to your caregiver about methods of family planning or contraception.  Talk to your caregiver about immunizations. Your caregiver may want you to have the following immunizations before leaving the hospital:  Tetanus, diphtheria, and pertussis (Tdap) or tetanus and diphtheria (Td) immunization. It is very important that you and your family (including grandparents) or others caring for your newborn are up-to-date with the Tdap or Td immunizations. The Tdap or Td immunization can help protect your newborn from getting ill.  Rubella immunization.  Varicella (chickenpox) immunization.  Influenza immunization. You should receive this annual immunization if you did not receive the immunization during your pregnancy. Document Released: 07/27/2007 Document Revised: 06/23/2012 Document Reviewed: 05/26/2012 [facility] Patient Information 2014 [place], [place].   Postpartum Depression and Baby Blues  The postpartum period begins right after the birth of a baby. During this time, there is often a great amount of joy and excitement. It is also a time of considerable changes in the life of the parent(s).  Regardless of how many times a mother gives birth, each child brings new challenges and dynamics to the family. It is not unusual to have feelings of excitement accompanied by confusing shifts in moods, emotions, and thoughts. All mothers are at risk of developing postpartum depression or the "baby blues." These mood changes can occur right after giving birth, or they may occur many months after giving birth. The baby blues or postpartum depression can be mild or severe. Additionally, postpartum depression can resolve rather quickly, or it can be a long-term condition. CAUSES Elevated hormones and their rapid decline are thought to be a main cause of postpartum depression and the baby blues. There are a number of hormones that radically change during and after pregnancy. Estrogen and progesterone usually decrease immediately after delivering your baby. The level of thyroid hormone and various cortisol steroids also rapidly drop. Other factors that play a major role in these changes include major life events and genetics.  RISK FACTORS If you have any of the following risks for the baby blues or postpartum depression, know what symptoms to watch out for during the postpartum period. Risk factors that may increase the likelihood of getting the baby blues or postpartum depression include: 1. Havinga personal or family history of depression. 2. Having depression while being pregnant. 3. Having premenstrual or oral contraceptive-associated mood issues. 4. Having exceptional life stress. 5. Having marital conflict. 6. Lacking a social support network. 7. Having a baby with special needs. 8. Having health problems such as diabetes. SYMPTOMS Baby blues symptoms include:  Brief fluctuations in mood, such as going from extreme happiness to sadness.  Decreased concentration.  Difficulty sleeping.  Crying spells, tearfulness.  Irritability.  Anxiety. Postpartum depression symptoms typically begin  within the first month after giving birth. These symptoms include:  Difficulty sleeping or excessive sleepiness.  Marked weight loss.  Agitation.  Feelings of worthlessness.  Lack of interest in activity or food. Postpartum psychosis is a very concerning condition and can be dangerous. Fortunately, it is rare. Displaying any of the following symptoms is cause for immediate medical attention. Postpartum psychosis symptoms include:  Hallucinations and delusions.  Bizarre or disorganized behavior.  Confusion or disorientation. DIAGNOSIS  A diagnosis is made by an evaluation of your symptoms. There are no medical or lab tests that lead to a diagnosis, but there are various questionnaires that a caregiver may use to identify those with  the baby blues, postpartum depression, or psychosis. Often times, a screening tool called the [place] Postnatal Depression Scale is used to diagnose depression in the postpartum period.  TREATMENT The baby blues usually goes away on its own in 1 to 2 weeks. Social support is often all that is needed. You should be encouraged to get adequate sleep and rest. Occasionally, you may be given medicines to help you sleep.  Postpartum depression requires treatment as it can last several months or longer if it is not treated. Treatment may include individual or group therapy, medicine, or both to address any social, physiological, and psychological factors that may play a role in the depression. Regular exercise, a healthy diet, rest, and social support may also be strongly recommended.  Postpartum psychosis is more serious and needs treatment right away. Hospitalization is often needed. HOME CARE INSTRUCTIONS  Get as much rest as you can. Nap when the baby sleeps.  Exercise regularly. Some women find yoga and walking to be beneficial.  Eat a balanced and nourishing diet.  Do little things that you enjoy. Have a cup of tea, take a bubble bath, read your favorite  magazine, or listen to your favorite music.  Avoid alcohol.  Ask for help with household chores, cooking, grocery shopping, or running errands as needed. Do not try to do everything.  Talk to people close to you about how you are feeling. Get support from your partner, family members, friends, or other new moms.  Try to stay positive in how you think. Think about the things you are grateful for.  Do not spend a lot of time alone.  Only take medicine as directed by your caregiver.  Keep all your postpartum appointments.  Let your caregiver know if you have any concerns. SEEK MEDICAL CARE IF: You are having a reaction or problems with your medicine. SEEK IMMEDIATE MEDICAL CARE IF:  You have suicidal feelings.  You feel you may harm the baby or someone else. Document Released: 07/03/2004 Document Revised: 12/22/2011 Document Reviewed: 08/05/2011 [facility] Patient Information 2014 [place], [place].     Breastfeeding Deciding to breastfeed is one of the best choices you can make for you and your baby. A change in hormones during pregnancy causes your breast tissue to grow and increases the number and size of your milk ducts. These hormones also allow proteins, sugars, and fats from your blood supply to make breast milk in your milk-producing glands. Hormones prevent breast milk from being released before your baby is born as well as prompt milk flow after birth. Once breastfeeding has begun, thoughts of your baby, as well as his or her sucking or crying, can stimulate the release of milk from your milk-producing glands.  BENEFITS OF BREASTFEEDING For Your Baby  Your first milk (colostrum) helps your baby's digestive system function better.   There are antibodies in your milk that help your baby fight off infections.   Your baby has a lower incidence of asthma, allergies, and sudden infant death syndrome.   The nutrients in breast milk are better for your baby than infant formulas  and are designed uniquely for your baby's needs.   Breast milk improves your baby's brain development.   Your baby is less likely to develop other conditions, such as childhood obesity, asthma, or type 2 diabetes mellitus.  For You   Breastfeeding helps to create a very special bond between you and your baby.   Breastfeeding is convenient. Breast milk is always available at the  correct temperature and costs nothing.   Breastfeeding helps to burn calories and helps you lose the weight gained during pregnancy.   Breastfeeding makes your uterus contract to its prepregnancy size faster and slows bleeding (lochia) after you give birth.   Breastfeeding helps to lower your risk of developing type 2 diabetes mellitus, osteoporosis, and breast or ovarian cancer later in life. SIGNS THAT YOUR BABY IS HUNGRY Early Signs of Hunger  Increased alertness or activity.  Stretching.  Movement of the head from side to side.  Movement of the head and opening of the mouth when the corner of the mouth or cheek is stroked (rooting).  Increased sucking sounds, smacking lips, cooing, sighing, or squeaking.  Hand-to-mouth movements.  Increased sucking of fingers or hands. Late Signs of Hunger  Fussing.  Intermittent crying. Extreme Signs of Hunger Signs of extreme hunger will require calming and consoling before your baby will be able to breastfeed successfully. Do not wait for the following signs of extreme hunger to occur before you initiate breastfeeding:   Restlessness.  A loud, strong cry.   Screaming.   BREASTFEEDING BASICS Breastfeeding Initiation  Find a comfortable place to sit or lie down, with your neck and back well supported.  Place a pillow or rolled up blanket under your baby to bring him or her to the level of your breast (if you are seated). Nursing pillows are specially designed to help support your arms and your baby while you breastfeed.  Make sure that your  baby's abdomen is facing your abdomen.   Gently massage your breast. With your fingertips, massage from your chest wall toward your nipple in a circular motion. This encourages milk flow. You may need to continue this action during the feeding if your milk flows slowly.  Support your breast with 4 fingers underneath and your thumb above your nipple. Make sure your fingers are well away from your nipple and your baby's mouth.   Stroke your baby's lips gently with your finger or nipple.   When your baby's mouth is open wide enough, quickly bring your baby to your breast, placing your entire nipple and as much of the colored area around your nipple (areola) as possible into your baby's mouth.   More areola should be visible above your baby's upper lip than below the lower lip.   Your baby's tongue should be between his or her lower gum and your breast.   Ensure that your baby's mouth is correctly positioned around your nipple (latched). Your baby's lips should create a seal on your breast and be turned out (everted).  It is common for your baby to suck about 2-3 minutes in order to start the flow of breast milk. Latching Teaching your baby how to latch on to your breast properly is very important. An improper latch can cause nipple pain and decreased milk supply for you and poor weight gain in your baby. Also, if your baby is not latched onto your nipple properly, he or she may swallow some air during feeding. This can make your baby fussy. Burping your baby when you switch breasts during the feeding can help to get rid of the air. However, teaching your baby to latch on properly is still the best way to prevent fussiness from swallowing air while breastfeeding. Signs that your baby has successfully latched on to your nipple:    Silent tugging or silent sucking, without causing you pain.   Swallowing heard between every 3-4 sucks.  Muscle movement above and in front of his or her  ears while sucking.  Signs that your baby has not successfully latched on to nipple:   Sucking sounds or smacking sounds from your baby while breastfeeding.  Nipple pain. If you think your baby has not latched on correctly, slip your finger into the corner of your baby's mouth to break the suction and place it between your baby's gums. Attempt breastfeeding initiation again. Signs of Successful Breastfeeding Signs from your baby:   A gradual decrease in the number of sucks or complete cessation of sucking.   Falling asleep.   Relaxation of his or her body.   Retention of a small amount of milk in his or her mouth.   Letting go of your breast by himself or herself. Signs from you:  Breasts that have increased in firmness, weight, and size 1-3 hours after feeding.   Breasts that are softer immediately after breastfeeding.  Increased milk volume, as well as a change in milk consistency and color by the fifth day of breastfeeding.   Nipples that are not sore, cracked, or bleeding. Signs That Your Pecola Leisure is Getting Enough Milk  Wetting at least 3 diapers in a 24-hour period. The urine should be clear and pale yellow by age 89 days.  At least 3 stools in a 24-hour period by age 89 days. The stool should be soft and yellow.  At least 3 stools in a 24-hour period by age 61 days. The stool should be seedy and yellow.  No loss of weight greater than 10% of birth weight during the first 71 days of age.  Average weight gain of 4-7 ounces (113-198 g) per week after age 69 days.  Consistent daily weight gain by age 89 days, without weight loss after the age of 2 weeks. After a feeding, your baby may spit up a small amount. This is common. BREASTFEEDING FREQUENCY AND DURATION Frequent feeding will help you make more milk and can prevent sore nipples and breast engorgement. Breastfeed when you feel the need to reduce the fullness of your breasts or when your baby shows signs of hunger.  This is called "breastfeeding on demand." Avoid introducing a pacifier to your baby while you are working to establish breastfeeding (the first 4-6 weeks after your baby is born). After this time you may choose to use a pacifier. Research has shown that pacifier use during the first year of a baby's life decreases the risk of sudden infant death syndrome (SIDS). Allow your baby to feed on each breast as long as he or she wants. Breastfeed until your baby is finished feeding. When your baby unlatches or falls asleep while feeding from the first breast, offer the second breast. Because newborns are often sleepy in the first few weeks of life, you may need to awaken your baby to get him or her to feed. Breastfeeding times will vary from baby to baby. However, the following rules can serve as a guide to help you ensure that your baby is properly fed:  Newborns (babies 80 weeks of age or younger) may breastfeed every 1-3 hours.  Newborns should not go longer than 3 hours during the day or 5 hours during the night without breastfeeding.  You should breastfeed your baby a minimum of 8 times in a 24-hour period until you begin to introduce solid foods to your baby at around 24 months of age. BREAST MILK PUMPING Pumping and storing breast milk allows you to ensure  that your baby is exclusively fed your breast milk, even at times when you are unable to breastfeed. This is especially important if you are going back to work while you are still breastfeeding or when you are not able to be present during feedings. Your lactation consultant can give you guidelines on how long it is safe to store breast milk.  A breast pump is a machine that allows you to pump milk from your breast into a sterile bottle. The pumped breast milk can then be stored in a refrigerator or freezer. Some breast pumps are operated by hand, while others use electricity. Ask your lactation consultant which type will work best for you. Breast pumps  can be purchased, but some hospitals and breastfeeding support groups lease breast pumps on a monthly basis. A lactation consultant can teach you how to hand express breast milk, if you prefer not to use a pump.  CARING FOR YOUR BREASTS WHILE YOU BREASTFEED Nipples can become dry, cracked, and sore while breastfeeding. The following recommendations can help keep your breasts moisturized and healthy:  Avoid using soap on your nipples.   Wear a supportive bra. Although not required, special nursing bras and tank tops are designed to allow access to your breasts for breastfeeding without taking off your entire bra or top. Avoid wearing underwire-style bras or extremely tight bras.  Air dry your nipples for 3-124minutes after each feeding.   Use only cotton bra pads to absorb leaked breast milk. Leaking of breast milk between feedings is normal.   Use lanolin on your nipples after breastfeeding. Lanolin helps to maintain your skin's normal moisture barrier. If you use pure lanolin, you do not need to wash it off before feeding your baby again. Pure lanolin is not toxic to your baby. You may also hand express a few drops of breast milk and gently massage that milk into your nipples and allow the milk to air dry. In the first few weeks after giving birth, some women experience extremely full breasts (engorgement). Engorgement can make your breasts feel heavy, warm, and tender to the touch. Engorgement peaks within 3-5 days after you give birth. The following recommendations can help ease engorgement:  Completely empty your breasts while breastfeeding or pumping. You may want to start by applying warm, moist heat (in the shower or with warm water-soaked hand towels) just before feeding or pumping. This increases circulation and helps the milk flow. If your baby does not completely empty your breasts while breastfeeding, pump any extra milk after he or she is finished.  Wear a snug bra (nursing or  regular) or tank top for 1-2 days to signal your body to slightly decrease milk production.  Apply ice packs to your breasts, unless this is too uncomfortable for you.  Make sure that your baby is latched on and positioned properly while breastfeeding. If engorgement persists after 48 hours of following these recommendations, contact your health care provider or a Advertising copywriterlactation consultant. OVERALL HEALTH CARE RECOMMENDATIONS WHILE BREASTFEEDING  Eat healthy foods. Alternate between meals and snacks, eating 3 of each per day. Because what you eat affects your breast milk, some of the foods may make your baby more irritable than usual. Avoid eating these foods if you are sure that they are negatively affecting your baby.  Drink milk, fruit juice, and water to satisfy your thirst (about 10 glasses a day).   Rest often, relax, and continue to take your prenatal vitamins to prevent fatigue, stress, and anemia.  Continue breast self-awareness checks.  Avoid chewing and smoking tobacco.  Avoid alcohol and drug use. Some medicines that may be harmful to your baby can pass through breast milk. It is important to ask your health care provider before taking any medicine, including all over-the-counter and prescription medicine as well as vitamin and herbal supplements. It is possible to become pregnant while breastfeeding. If birth control is desired, ask your health care provider about options that will be safe for your baby. SEEK MEDICAL CARE IF:   You feel like you want to stop breastfeeding or have become frustrated with breastfeeding.  You have painful breasts or nipples.  Your nipples are cracked or bleeding.  Your breasts are red, tender, or warm.  You have a swollen area on either breast.  You have a fever or chills.  You have nausea or vomiting.  You have drainage other than breast milk from your nipples.  Your breasts do not become full before feedings by the fifth day after you  give birth.  You feel sad and depressed.  Your baby is too sleepy to eat well.  Your baby is having trouble sleeping.   Your baby is wetting less than 3 diapers in a 24-hour period.  Your baby has less than 3 stools in a 24-hour period.  Your baby's skin or the white part of his or her eyes becomes yellow.   Your baby is not gaining weight by 19 days of age. SEEK IMMEDIATE MEDICAL CARE IF:   Your baby is overly tired (lethargic) and does not want to wake up and feed.  Your baby develops an unexplained fever. Document Released: 09/29/2005 Document Revised: 10/04/2013 Document Reviewed: 03/23/2013 [facility] Patient Information 2015 [place], [place]. This information is not intended to replace advice given to you by your health care provider. Make sure you discuss any questions you have with your health care provider.

## 2014-09-04 NOTE — Progress Notes (Signed)
UR chart review completed.  

## 2014-09-05 LAB — CBC
HCT: 25.8 % — ABNORMAL LOW (ref 36.0–46.0)
Hemoglobin: 8.1 g/dL — ABNORMAL LOW (ref 12.0–15.0)
MCH: 21.5 pg — AB (ref 26.0–34.0)
MCHC: 31.4 g/dL (ref 30.0–36.0)
MCV: 68.4 fL — ABNORMAL LOW (ref 78.0–100.0)
PLATELETS: 182 10*3/uL (ref 150–400)
RBC: 3.77 MIL/uL — ABNORMAL LOW (ref 3.87–5.11)
RDW: 15.3 % (ref 11.5–15.5)
WBC: 10.1 10*3/uL (ref 4.0–10.5)

## 2014-09-05 MED ORDER — FERROUS SULFATE 325 (65 FE) MG PO TABS: 325.0000 mg | ORAL_TABLET | Freq: Two times a day (BID) | ORAL | Status: DC

## 2014-09-05 MED ORDER — IBUPROFEN 600 MG PO TABS: 600.0000 mg | ORAL_TABLET | Freq: Four times a day (QID) | ORAL | Status: DC

## 2014-09-05 MED ORDER — OXYCODONE-ACETAMINOPHEN 5-325 MG PO TABS: 1.0000 | ORAL_TABLET | ORAL | Status: DC | PRN

## 2014-09-05 NOTE — Lactation Note (Addendum)
This note was copied from the chart of Kelli Jahni Rikard. Lactation Consultation Note: Follow up visit with this experienced BF mom. She has baby latched to breast when I went in- reports he has been nursing for 20 minutes. He came off breast then still fussy so she latched him again. Reports breasts are very heavy today. Lots of swallows noted when baby nursing and mom reports that breast feels softer. Reviewed engorgement prevention and treatment. Has manual pump for home. RN gave comfort gels to mom. No questions at present. To call prn  Patient Name: Kelli [name] RUEAV'WToday's Date: 09/05/2014 Reason for consult: Follow-up assessment;Late preterm infant;Infant < 6lbs   Maternal Data Formula Feeding for Exclusion: No Has patient been taught Hand Expression?: Yes Does the patient have breastfeeding experience prior to this delivery?: Yes  Feeding Feeding Type: Breast Fed Length of feed: 20 min  LATCH Score/Interventions Latch: Grasps breast easily, tongue down, lips flanged, rhythmical sucking.  Audible Swallowing: Spontaneous and intermittent  Type of Nipple: Everted at rest and after stimulation  Comfort (Breast/Nipple): Soft / non-tender  Problem noted: Filling Interventions (Mild/moderate discomfort): Comfort gels;Pre-pump if needed (full, not currenlty engorged)  Hold (Positioning): No assistance needed to correctly position infant at breast.  LATCH Score: 10  Lactation Tools Discussed/Used     Consult Status Consult Status: Complete    Pamelia HoitWeeks, Kelli Griffin 09/05/2014, 8:36 AM

## 2014-09-05 NOTE — Plan of Care (Signed)
Problem: Discharge Progression Outcomes Goal: Activity appropriate for discharge plan Outcome: Completed/Met Date Met:  09/05/14 Goal: Tolerating diet Outcome: Completed/Met Date Met:  09/05/14 Goal: Pain controlled with appropriate interventions Outcome: Completed/Met Date Met:  09/05/14 Goal: Discharge plan in place and appropriate Outcome: Completed/Met Date Met:  09/05/14

## 2014-09-06 ENCOUNTER — Ambulatory Visit: Payer: Self-pay

## 2014-09-06 NOTE — Lactation Note (Signed)
This note was copied from the chart of Kelli Aamina Kloosterman. Lactation Consultation Note  Follow up made.  Mom's breasts are becoming engorged more on right.  DEBP set up and initiated.  Instructed mom to give EBM back to baby.  [facility].  Patient Name: Kelli Griffin ZOXWR'UToday's Date: 09/06/2014 Reason for consult: Follow-up assessment;Infant < 6lbs;Hyperbilirubinemia   Maternal Data    Feeding Feeding Type: Breast Milk Length of feed: 15 min  LATCH Score/Interventions                      Lactation Tools Discussed/Used Pump Review: Setup, frequency, and cleaning Initiated by:: LMoulden RN IBCLC Date initiated:: 09/06/14   Consult Status Consult Status: Follow-up Date: 09/07/14 Follow-up type: In-patient    Huston FoleyMOULDEN, Irish Piech S 09/06/2014, 4:29 PM

## 2014-09-07 ENCOUNTER — Ambulatory Visit: Payer: Self-pay

## 2014-09-07 NOTE — Lactation Note (Signed)
This note was copied from the chart of Kelli Griffin. Lactation Consultation Note  Baby sleeping under phototherapy lights. FOB translates to mother.  Mother is using DEBP because her breasts are engorged. Suggest she pump to soften only and apply ice packs 15 min top and bottom to breasts every 3 hours.  Mother denies problems with breastfeeding and has noted swallowing.  Mother states she has been given manual breastpump.  Suggest mother call for assistance if needed with next feeding.      Patient Name: Kelli Cyndie MullWaida Kartes UJWJX'BToday's Date: 09/07/2014 Reason for consult: Follow-up assessment   Maternal Data    Feeding Feeding Type: Breast Milk Length of feed: 30 min  LATCH Score/Interventions                      Lactation Tools Discussed/Used     Consult Status Consult Status: Follow-up Date: 09/08/14 Follow-up type: In-patient    Dahlia ByesBerkelhammer, Ruth [facility]oschen 09/07/2014, 10:02 AM

## 2014-10-17 ENCOUNTER — Inpatient Hospital Stay (HOSPITAL_COMMUNITY)
Admission: AD | Admit: 2014-10-17 | Discharge: 2014-10-17 | Disposition: A | Discharge status: Home / Self Care | Payer: Medicaid Other | Source: Ambulatory Visit | Attending: Obstetrics and Gynecology | Admitting: Obstetrics and Gynecology

## 2014-10-17 ENCOUNTER — Encounter (HOSPITAL_COMMUNITY): Payer: Self-pay | Admitting: *Deleted

## 2014-10-17 DIAGNOSIS — O9122 Nonpurulent mastitis associated with the puerperium: Secondary | ICD-10-CM | POA: Insufficient documentation

## 2014-10-17 DIAGNOSIS — R350 Frequency of micturition: Secondary | ICD-10-CM | POA: Insufficient documentation

## 2014-10-17 DIAGNOSIS — O9989 Other specified diseases and conditions complicating pregnancy, childbirth and the puerperium: Secondary | ICD-10-CM | POA: Insufficient documentation

## 2014-10-17 DIAGNOSIS — M549 Dorsalgia, unspecified: Secondary | ICD-10-CM | POA: Insufficient documentation

## 2014-10-17 DIAGNOSIS — R030 Elevated blood-pressure reading, without diagnosis of hypertension: Secondary | ICD-10-CM | POA: Insufficient documentation

## 2014-10-17 DIAGNOSIS — N61 Mastitis without abscess: Secondary | ICD-10-CM | POA: Diagnosis present

## 2014-10-17 DIAGNOSIS — D649 Anemia, unspecified: Secondary | ICD-10-CM | POA: Diagnosis present

## 2014-10-17 LAB — URINALYSIS, ROUTINE W REFLEX MICROSCOPIC
BILIRUBIN URINE: NEGATIVE
GLUCOSE, UA: NEGATIVE mg/dL
Ketones, ur: 15 mg/dL — AB
Leukocytes, UA: NEGATIVE
Nitrite: NEGATIVE
PH: 6 (ref 5.0–8.0)
Protein, ur: 30 mg/dL — AB
Specific Gravity, Urine: 1.025 (ref 1.005–1.030)
Urobilinogen, UA: 0.2 mg/dL (ref 0.0–1.0)

## 2014-10-17 LAB — COMPREHENSIVE METABOLIC PANEL
ALT: 24 U/L (ref 0–35)
AST: 27 U/L (ref 0–37)
Albumin: 3.6 g/dL (ref 3.5–5.2)
Alkaline Phosphatase: 97 U/L (ref 39–117)
Anion gap: 8 (ref 5–15)
BUN: 11 mg/dL (ref 6–23)
CALCIUM: 8 mg/dL — AB (ref 8.4–10.5)
CO2: 24 mmol/L (ref 19–32)
Chloride: 107 mEq/L (ref 96–112)
Creatinine, Ser: 0.67 mg/dL (ref 0.50–1.10)
GFR calc Af Amer: >90 mL/min (ref >90)
Glucose, Bld: 85 mg/dL (ref 70–99)
Potassium: 3.2 mmol/L — ABNORMAL LOW (ref 3.5–5.1)
Sodium: 139 mmol/L (ref 135–145)
TOTAL PROTEIN: 6.6 g/dL (ref 6.0–8.3)
Total Bilirubin: 0.6 mg/dL (ref 0.3–1.2)

## 2014-10-17 LAB — URINE MICROSCOPIC-ADD ON

## 2014-10-17 LAB — PROTEIN / CREATININE RATIO, URINE
CREATININE, URINE: 205 mg/dL
PROTEIN CREATININE RATIO: 0.52 — AB (ref 0.00–0.15)
TOTAL PROTEIN, URINE: 107 mg/dL

## 2014-10-17 LAB — CBC WITH DIFFERENTIAL/PLATELET
Basophils Absolute: 0 10*3/uL (ref 0.0–0.1)
Basophils Relative: 0 % (ref 0–1)
EOS ABS: 0.7 10*3/uL (ref 0.0–0.7)
Eosinophils Relative: 3 % (ref 0–5)
HCT: 32.4 % — ABNORMAL LOW (ref 36.0–46.0)
Hemoglobin: 9.9 g/dL — ABNORMAL LOW (ref 12.0–15.0)
LYMPHS ABS: 1.6 10*3/uL (ref 0.7–4.0)
Lymphocytes Relative: 8 % — ABNORMAL LOW (ref 12–46)
MCH: 20.7 pg — ABNORMAL LOW (ref 26.0–34.0)
MCHC: 30.6 g/dL (ref 30.0–36.0)
MCV: 67.8 fL — ABNORMAL LOW (ref 78.0–100.0)
MONOS PCT: 6 % (ref 3–12)
Monocytes Absolute: 1.1 10*3/uL — ABNORMAL HIGH (ref 0.1–1.0)
NEUTROS ABS: 16.6 10*3/uL — AB (ref 1.7–7.7)
Neutrophils Relative %: 83 % — ABNORMAL HIGH (ref 43–77)
Platelets: 177 10*3/uL (ref 150–400)
RBC: 4.78 MIL/uL (ref 3.87–5.11)
RDW: 16.4 % — AB (ref 11.5–15.5)
WBC: 20 10*3/uL — AB (ref 4.0–10.5)

## 2014-10-17 LAB — LACTATE DEHYDROGENASE: LDH: 212 U/L (ref 94–250)

## 2014-10-17 LAB — URIC ACID: Uric Acid, Serum: 4 mg/dL (ref 2.4–7.0)

## 2014-10-17 LAB — POCT PREGNANCY, URINE: Preg Test, Ur: NEGATIVE

## 2014-10-17 MED ORDER — CEFAZOLIN SODIUM-DEXTROSE 2-3 GM-% IV SOLR
2.0000 g | Freq: Once | INTRAVENOUS | Status: AC
  Administered 2014-10-17: 2 g via INTRAVENOUS
  Filled 2014-10-17: qty 50

## 2014-10-17 MED ORDER — LACTATED RINGERS IV SOLN
INTRAVENOUS | Status: DC
  Administered 2014-10-17: 10:00:00 via INTRAVENOUS

## 2014-10-17 MED ORDER — KETOROLAC TROMETHAMINE 30 MG/ML IJ SOLN
30.0000 mg | Freq: Once | INTRAMUSCULAR | Status: AC
  Administered 2014-10-17: 30 mg via INTRAVENOUS
  Filled 2014-10-17: qty 1

## 2014-10-17 MED ORDER — CEPHALEXIN 500 MG PO CAPS: 500.0000 mg | ORAL_CAPSULE | Freq: Four times a day (QID) | ORAL | Status: AC

## 2014-10-17 MED ORDER — ACETAMINOPHEN 325 MG PO TABS
650.0000 mg | ORAL_TABLET | Freq: Once | ORAL | Status: AC
  Administered 2014-10-17: 650 mg via ORAL
  Filled 2014-10-17: qty 2

## 2014-10-17 MED ORDER — IBUPROFEN 600 MG PO TABS: ORAL_TABLET | ORAL | Status: DC

## 2014-10-17 MED ORDER — LACTATED RINGERS IV BOLUS (SEPSIS)
500.0000 mL | Freq: Once | INTRAVENOUS | Status: AC
  Administered 2014-10-17: 500 mL via INTRAVENOUS

## 2014-10-17 NOTE — Discharge Instructions (Signed)
Rest, rest, rest! Drink lots of water and other fluids. Take Ibuprophen every 6 hours for 2 days, then can take only if needed for pain.  New prescription sent to pharmacy. Take antibiotic (Keflex, cephalexin) every 6 hours for 1 week.  New prescription sent to pharmacy. If you feel bad, take your temperature.  If 100.4 or above, come back to hospital. Come back to [facility] if you have any problems.  Go to [facility] office on Thursday in the morning at 8:45am for check up.  Breastfeeding and Mastitis Mastitis is inflammation of the breast tissue. It can occur in women who are breastfeeding. This can make breastfeeding painful. Mastitis will sometimes go away on its own. Your health care provider will help determine if treatment is needed. CAUSES Mastitis is often associated with a blocked milk (lactiferous) duct. This can happen when too much milk builds up in the breast. Causes of excess milk in the breast can include:  Poor latch-on. If your baby is not latched onto the breast properly, she or he may not empty your breast completely while breastfeeding.  Allowing too much time to pass between feedings.  Wearing a bra or other clothing that is too tight. This puts extra pressure on the lactiferous ducts so milk does not flow through them as it should. Mastitis can also be caused by a bacterial infection. Bacteria may enter the breast tissue through cuts or openings in the skin. In women who are breastfeeding, this may occur because of cracked or irritated skin. Cracks in the skin are often caused when your baby does not latch on properly to the breast. SIGNS AND SYMPTOMS  Swelling, redness, tenderness, and pain in an area of the breast.  Swelling of the glands under the arm on the same side.  Fever may or may not accompany mastitis. If an infection is allowed to progress, a collection of pus (abscess) may develop. DIAGNOSIS  Your health care provider can usually diagnose  mastitis based on your symptoms and a physical exam. Tests may be done to help confirm the diagnosis. These may include:  Removal of pus from the breast by applying pressure to the area. This pus can be examined in the lab to determine which bacteria are present. If an abscess has developed, the fluid in the abscess can be removed with a needle. This can also be used to confirm the diagnosis and determine the bacteria present. In most cases, pus will not be present.  Blood tests to determine if your body is fighting a bacterial infection.  Mammogram or ultrasound tests to rule out other problems or diseases. TREATMENT  Mastitis that occurs with breastfeeding will sometimes go away on its own. Your health care provider may choose to wait 24 hours after first seeing you to decide whether a prescription medicine is needed. If your symptoms are worse after 24 hours, your health care provider will likely prescribe an antibiotic medicine to treat the mastitis. He or she will determine which bacteria are most likely causing the infection and will then select an appropriate antibiotic medicine. This is sometimes changed based on the results of tests performed to identify the bacteria, or if there is no response to the antibiotic medicine selected. Antibiotic medicines are usually given by mouth. You may also be given medicine for pain. HOME CARE INSTRUCTIONS  Only take over-the-counter or prescription medicines for pain, fever, or discomfort as directed by your health care provider.  If your health care provider prescribed  an antibiotic medicine, take the medicine as directed. Make sure you finish it even if you start to feel better.  Do not wear a tight or underwire bra. Wear a soft, supportive bra.  Increase your fluid intake, especially if you have a fever.  Continue to empty the breast. Your health care provider can tell you whether this milk is safe for your infant or needs to be thrown out. You may  be told to stop nursing until your health care provider thinks it is safe for your baby. Use a breast pump if you are advised to stop nursing.  Keep your nipples clean and dry.  Empty the first breast completely before going to the other breast. If your baby is not emptying your breasts completely for some reason, use a breast pump to empty your breasts.  If you go back to work, pump your breasts while at work to stay in time with your nursing schedule.  Avoid allowing your breasts to become overly filled with milk (engorged). SEEK MEDICAL CARE IF:  You have pus-like discharge from the breast.  Your symptoms do not improve with the treatment prescribed by your health care provider within 2 days. SEEK IMMEDIATE MEDICAL CARE IF:  Your pain and swelling are getting worse.  You have pain that is not controlled with medicine.  You have a red line extending from the breast toward your armpit.  You have a fever or persistent symptoms for more than 2-3 days.  You have a fever and your symptoms suddenly get worse. MAKE SURE YOU:   Understand these instructions.  Will watch your condition.  Will get help right away if you are not doing well or get worse. Document Released: 01/24/2005 Document Revised: 10/04/2013 Document Reviewed: 05/05/2013 [facility] Patient Information 2015 [place], [place]. This information is not intended to replace advice given to you by your health care provider. Make sure you discuss any questions you have with your health care provider.  Urinary Tract Infection A urinary tract infection (UTI) can occur any place along the urinary tract. The tract includes the kidneys, ureters, bladder, and urethra. A type of germ called bacteria often causes a UTI. UTIs are often helped with antibiotic medicine.  HOME CARE   If given, take antibiotics as told by your doctor. Finish them even if you start to feel better.  Drink enough fluids to keep your pee (urine) clear or pale  yellow.  Avoid tea, drinks with caffeine, and bubbly (carbonated) drinks.  Pee often. Avoid holding your pee in for a long time.  Pee before and after having sex (intercourse).  Wipe from front to back after you poop (bowel movement) if you are a woman. Use each tissue only once. GET HELP RIGHT AWAY IF:   You have back pain.  You have lower belly (abdominal) pain.  You have chills.  You feel sick to your stomach (nauseous).  You throw up (vomit).  Your burning or discomfort with peeing does not go away.  You have a fever.  Your symptoms are not better in 3 days. MAKE SURE YOU:   Understand these instructions.  Will watch your condition.  Will get help right away if you are not doing well or get worse. Document Released: 03/17/2008 Document Revised: 06/23/2012 Document Reviewed: 04/29/2012 [facility] Patient Information 2015 [place], [place]. This information is not intended to replace advice given to you by your health care provider. Make sure you discuss any questions you have with your health care provider.

## 2014-10-17 NOTE — MAU Provider Note (Signed)
History   29 yo G2P2002 at 2 weeks s/p SVB presented unannounced c/o pain in right breast, back pain, urinary frequency, fever, and chills.  Sx started yesterday.  Denies HA, visual sx, epigastric pain, N/V, abdominal pain, perineal pain, dysuria, heavy vaginal bleeding, foul d/c, or any other sx.  Reports good milk production.  Hx of mastitis after 1st delivery--"feels same".  Husband serving as translator--patient declines Tax inspector.  Took Ibuprophen yesterday, none since MN today.  Patient Active Problem List   Diagnosis Date Noted  . Mastitis 10/17/2014  . IUGR (intrauterine growth restriction) 09/01/2014  . Vaginal delivery 07/26/2012  . Midline episiotomy 07/26/2012  . Language barrier 05/12/2012    Chief Complaint  Patient presents with  . Breast Pain  . Back Pain   HPI:  As above  OB History    Gravida Para Term Preterm AB TAB SAB Ectopic Multiple Living   0 0 0 0 0 0 2      Past Medical History  Diagnosis Date  . No pertinent past medical history   . Pregnant   . Language barrier 05/12/2012    Past Surgical History  Procedure Laterality Date  . No past surgeries      Family History  Problem Relation Age of Onset  . Other Neg Hx   . Arthritis Neg Hx   . Alcohol abuse Neg Hx   . Asthma Neg Hx   . Birth defects Neg Hx   . Cancer Neg Hx   . COPD Neg Hx   . Depression Neg Hx   . Diabetes Neg Hx   . Drug abuse Neg Hx   . Early death Neg Hx   . Hearing loss Neg Hx   . Heart disease Neg Hx   . Hyperlipidemia Neg Hx   . Hypertension Neg Hx   . Kidney disease Neg Hx   . Learning disabilities Neg Hx   . Mental illness Neg Hx   . Mental retardation Neg Hx   . Miscarriages / Stillbirths Neg Hx   . Stroke Neg Hx   . Vision loss Neg Hx   . Varicose Veins Neg Hx     History  Substance Use Topics  . Smoking status: Never Smoker   . Smokeless tobacco: Never Used  . Alcohol Use: No    Allergies: No Known Allergies  Prescriptions prior to admission   Medication Sig Dispense Refill Last Dose  . ferrous sulfate 325 (65 FE) MG tablet Take 1 tablet (325 mg total) by mouth 2 (two) times daily with a meal. 60 tablet 3   . ibuprofen (ADVIL,MOTRIN) 600 MG tablet Take 1 tablet (600 mg total) by mouth every 6 (six) hours. 30 tablet 0   . oxyCODONE-acetaminophen (PERCOCET/ROXICET) 5-325 MG per tablet Take 1 tablet by mouth every 4 (four) hours as needed (for pain scale less than 7). 30 tablet 0   . Prenatal Vit-Fe Fumarate-FA (PRENATAL MULTIVITAMIN) TABS tablet Take 1 tablet by mouth daily at 12 noon.   Past Month at Unknown time    ROS:  Fever, chills, pain in right breast, mid/low back pain. Physical Exam   Blood pressure 134/87, pulse 115, temperature 99.1 F (37.3 C), temperature source Oral, resp. rate 18, unknown if currently breastfeeding.  Physical Exam  In moderate distress from back pain and right breast pain, tearful Chest clear Heart RRR without murmur, tachycardia Back--no CVAT, mild pain in mid/lower back to deep pressure. Breasts--right breast with approx 4  cm area of erythema on outer aspect, no evidence abscess, no nipple trauma.  Left breast WNL, lactating, full. Abd soft, NT Pelvic--no lochia noted, uterus NT, well-involuted.  Perineum healing well. Ext--no edema, negative Homan's  ED Course  Assessment: 2 weeks s/p SVB Right breast mastitis Back pain Urinary frequency Mildly elevated BP  Plan: IV hydration CBC, diff, CMP, LDH, uric acid UA, UPT, urine culture, PCR Toradol 30 mg IV Tylenol 650 mg po Per consult with Dr. Su Hiltoberts, Ancef 2 gm IV now. Anticipate d/c home on Keflex 500 mg po BID x 7 days. Will have patient pump while in MAU to avoid engorgement.   Kelli Griffin, Kelli Griffin CNM, MSN 10/17/2014 8:48 AM  Addendum: Feeling much better after IV hydration, Ancef 2 gm IV, Tylenol, and Toradol IV.  Pumped breasts with great production.  Results for orders placed or performed during the hospital encounter of  10/17/14 (from the past 24 hour(s))  Urinalysis, Routine w reflex microscopic     Status: Abnormal   Collection Time: 10/17/14  8:20 AM  Result Value Ref Range   Color, Urine YELLOW YELLOW   APPearance CLEAR CLEAR   Specific Gravity, Urine 1.025 1.005 - 1.030   pH 6.0 5.0 - 8.0   Glucose, UA NEGATIVE NEGATIVE mg/dL   Hgb urine dipstick TRACE (A) NEGATIVE   Bilirubin Urine NEGATIVE NEGATIVE   Ketones, ur 15 (A) NEGATIVE mg/dL   Protein, ur 30 (A) NEGATIVE mg/dL   Urobilinogen, UA 0.2 0.0 - 1.0 mg/dL   Nitrite NEGATIVE NEGATIVE   Leukocytes, UA NEGATIVE NEGATIVE  Urine microscopic-add on     Status: None   Collection Time: 10/17/14  8:20 AM  Result Value Ref Range   Squamous Epithelial / LPF RARE RARE   WBC, UA 0-2 <3 WBC/hpf   RBC / HPF 0-2 <3 RBC/hpf  Protein / creatinine ratio, urine     Status: Abnormal   Collection Time: 10/17/14  8:20 AM  Result Value Ref Range   Creatinine, Urine 205.00 mg/dL   Total Protein, Urine 107 mg/dL   Protein Creatinine Ratio 0.52 (H) 0.00 - 0.15  Pregnancy, urine POC     Status: None   Collection Time: 10/17/14  8:25 AM  Result Value Ref Range   Preg Test, Ur NEGATIVE NEGATIVE  CBC with Differential     Status: Abnormal   Collection Time: 10/17/14  9:19 AM  Result Value Ref Range   WBC 20.0 (H) 4.0 - 10.5 K/uL   RBC 4.78 3.87 - 5.11 MIL/uL   Hemoglobin 9.9 (L) 12.0 - 15.0 g/dL   HCT 04.532.4 (L) 40.936.0 - 81.146.0 %   MCV 67.8 (L) 78.0 - 100.0 fL   MCH 20.7 (L) 26.0 - 34.0 pg   MCHC 30.6 30.0 - 36.0 g/dL   RDW 91.416.4 (H) 78.211.5 - 95.615.5 %   Platelets 177 150 - 400 K/uL   Neutrophils Relative % 83 (H) 43 - 77 %   Neutro Abs 16.6 (H) 1.7 - 7.7 K/uL   Lymphocytes Relative 8 (L) 12 - 46 %   Lymphs Abs 1.6 0.7 - 4.0 K/uL   Monocytes Relative 6 3 - 12 %   Monocytes Absolute 1.1 (H) 0.1 - 1.0 K/uL   Eosinophils Relative 3 0 - 5 %   Eosinophils Absolute 0.7 0.0 - 0.7 K/uL   Basophils Relative 0 0 - 1 %   Basophils Absolute 0.0 0.0 - 0.1 K/uL  Comprehensive  metabolic panel     Status: Abnormal  Collection Time: 10/17/14  9:19 AM  Result Value Ref Range   Sodium 139 135 - 145 mmol/L   Potassium 3.2 (L) 3.5 - 5.1 mmol/L   Chloride 107 96 - 112 mEq/L   CO2 24 19 - 32 mmol/L   Glucose, Bld 85 70 - 99 mg/dL   BUN 11 6 - 23 mg/dL   Creatinine, Ser 1.61 0.50 - 1.10 mg/dL   Calcium 8.0 (L) 8.4 - 10.5 mg/dL   Total Protein 6.6 6.0 - 8.3 g/dL   Albumin 3.6 3.5 - 5.2 g/dL   AST 27 0 - 37 U/L   ALT 24 0 - 35 U/L   Alkaline Phosphatase 97 39 - 117 U/L   Total Bilirubin 0.6 0.3 - 1.2 mg/dL   GFR calc non Af Amer >90 >90 mL/min   GFR calc Af Amer >90 >90 mL/min   Anion gap 8 5 - 15  Uric acid     Status: None   Collection Time: 10/17/14  9:19 AM  Result Value Ref Range   Uric Acid, Serum 4.0 2.4 - 7.0 mg/dL  Lactate dehydrogenase     Status: None   Collection Time: 10/17/14  9:19 AM  Result Value Ref Range   LDH 212 94 - 250 U/L   Urine culture pending. PCR likely elevated due to UTI.  Filed Vitals:   10/17/14 0818 10/17/14 0840 10/17/14 1058 10/17/14 1059  BP: 139/94 134/87 121/79 117/80  Pulse: 117 115 105 103  Temp: 99.1 F (37.3 C)     TempSrc: Oral     Resp: Plan: D/C home. Rx Keflex 500 mg po QID x 7 days Rx Ibuprophen 600 mg po q 6 hours ATC x 24 hours, then as needed. Return to office on Thursday at 8:45am for recheck of status. Precautions reviewed with patient and husband--to return with any worsening of sx, with fever > or = 100.4, or any other issue. Patient to rest, push fluids, and continue to breastfeed.  Kelli Bridgeman, CNM 10/17/14 11:30am

## 2014-10-17 NOTE — Lactation Note (Signed)
Lactation Consultation Note   P2, [place].  FOB interpreting.  Mother states she has been breastfeeding more than 5 times a day. Encouraged mother not to go more than 4 hours without emptying breast. Mother's right lower breast has warm, hard tender area at 7'oclock on breast. Mother had been icing breasts since arrival and engorgement had been improved.  Mother had pumped copious amount of breastmilk.   Offered another set of ice packs for mother to sandwich breast.  Recommend ice packs every 2-3 hours for 15-20 min until engorgement subsides. Assisted mother with pumping right breast and massaged while she pumped and area began to soften. Good flow of breastmilk while pumping. Suggest mother contact WIC to see if she can get a DEBP.     Patient Name: Kelli MullWaida Griffin ZOXWR'UToday's Date: 10/17/2014 Reason for consult: Initial assessment   Maternal Data    Feeding    LATCH Score/Interventions                      Lactation Tools Discussed/Used     Consult Status Consult Status: Complete    Hardie PulleyBerkelhammer, Darrik Richman Boschen 10/17/2014, 10:39 AM

## 2014-10-17 NOTE — MAU Note (Signed)
Pain in breast started yesterday while feeding infant.  Also having some low back pain.  (delivered 09/03/14)

## 2014-10-18 LAB — URINE CULTURE
Colony Count: NO GROWTH
Culture: NO GROWTH

## 2014-12-29 DIAGNOSIS — R1013 Epigastric pain: Secondary | ICD-10-CM | POA: Insufficient documentation

## 2014-12-29 DIAGNOSIS — Z79899 Other long term (current) drug therapy: Secondary | ICD-10-CM | POA: Insufficient documentation

## 2014-12-29 DIAGNOSIS — Z3202 Encounter for pregnancy test, result negative: Secondary | ICD-10-CM | POA: Insufficient documentation

## 2014-12-30 ENCOUNTER — Encounter (HOSPITAL_COMMUNITY): Payer: Self-pay | Admitting: *Deleted

## 2014-12-30 ENCOUNTER — Emergency Department (HOSPITAL_COMMUNITY)
Admission: EM | Admit: 2014-12-30 | Discharge: 2014-12-30 | Disposition: A | Discharge status: Home / Self Care | Payer: Self-pay | Attending: Emergency Medicine | Admitting: Emergency Medicine

## 2014-12-30 ENCOUNTER — Emergency Department (HOSPITAL_COMMUNITY): Payer: Self-pay

## 2014-12-30 DIAGNOSIS — R109 Unspecified abdominal pain: Secondary | ICD-10-CM

## 2014-12-30 LAB — COMPREHENSIVE METABOLIC PANEL
ALT: 11 U/L (ref 0–35)
AST: 20 U/L (ref 0–37)
Albumin: 3.8 g/dL (ref 3.5–5.2)
Alkaline Phosphatase: 108 U/L (ref 39–117)
Anion gap: 10 (ref 5–15)
BILIRUBIN TOTAL: 0.4 mg/dL (ref 0.3–1.2)
BUN: 14 mg/dL (ref 6–23)
CO2: 22 mmol/L (ref 19–32)
Calcium: 8.8 mg/dL (ref 8.4–10.5)
Chloride: 108 mmol/L (ref 96–112)
Creatinine, Ser: 0.69 mg/dL (ref 0.50–1.10)
GFR calc Af Amer: >90 mL/min (ref >90)
GLUCOSE: 116 mg/dL — AB (ref 70–99)
Potassium: 3.4 mmol/L — ABNORMAL LOW (ref 3.5–5.1)
SODIUM: 140 mmol/L (ref 135–145)
Total Protein: 7.2 g/dL (ref 6.0–8.3)

## 2014-12-30 LAB — CBC WITH DIFFERENTIAL/PLATELET
BASOS ABS: 0.1 10*3/uL (ref 0.0–0.1)
BASOS PCT: 1 % (ref 0–1)
EOS ABS: 0.6 10*3/uL (ref 0.0–0.7)
EOS PCT: 8 % — AB (ref 0–5)
HCT: 32.3 % — ABNORMAL LOW (ref 36.0–46.0)
HEMOGLOBIN: 9.8 g/dL — AB (ref 12.0–15.0)
LYMPHS PCT: 46 % (ref 12–46)
Lymphs Abs: 3.5 10*3/uL (ref 0.7–4.0)
MCH: 20.2 pg — ABNORMAL LOW (ref 26.0–34.0)
MCHC: 30.3 g/dL (ref 30.0–36.0)
MCV: 66.7 fL — AB (ref 78.0–100.0)
Monocytes Absolute: 0.5 10*3/uL (ref 0.1–1.0)
Monocytes Relative: 6 % (ref 3–12)
NEUTROS ABS: 3 10*3/uL (ref 1.7–7.7)
NEUTROS PCT: 39 % — AB (ref 43–77)
PLATELETS: 227 10*3/uL (ref 150–400)
RBC: 4.84 MIL/uL (ref 3.87–5.11)
RDW: 16.4 % — ABNORMAL HIGH (ref 11.5–15.5)
WBC: 7.7 10*3/uL (ref 4.0–10.5)

## 2014-12-30 LAB — URINALYSIS, ROUTINE W REFLEX MICROSCOPIC
Bilirubin Urine: NEGATIVE
GLUCOSE, UA: NEGATIVE mg/dL
Hgb urine dipstick: NEGATIVE
Ketones, ur: NEGATIVE mg/dL
Leukocytes, UA: NEGATIVE
NITRITE: NEGATIVE
PH: 6 (ref 5.0–8.0)
Protein, ur: NEGATIVE mg/dL
SPECIFIC GRAVITY, URINE: 1.022 (ref 1.005–1.030)
Urobilinogen, UA: 0.2 mg/dL (ref 0.0–1.0)

## 2014-12-30 LAB — LIPASE, BLOOD: Lipase: 33 U/L (ref 11–59)

## 2014-12-30 LAB — POC URINE PREG, ED: PREG TEST UR: NEGATIVE

## 2014-12-30 MED ORDER — SODIUM CHLORIDE 0.9 % IV SOLN
INTRAVENOUS | Status: DC
  Administered 2014-12-30: 100 mL/h via INTRAVENOUS

## 2014-12-30 MED ORDER — IOHEXOL 300 MG/ML  SOLN
80.0000 mL | Freq: Once | INTRAMUSCULAR | Status: AC | PRN
  Administered 2014-12-30: 80 mL via INTRAVENOUS

## 2014-12-30 MED ORDER — SODIUM CHLORIDE 0.9 % IV BOLUS (SEPSIS)
500.0000 mL | Freq: Once | INTRAVENOUS | Status: AC
  Administered 2014-12-30: 500 mL via INTRAVENOUS

## 2014-12-30 MED ORDER — PROMETHAZINE HCL 25 MG PO TABS
25.0000 mg | ORAL_TABLET | Freq: Four times a day (QID) | ORAL | Status: DC | PRN
Start: 2014-12-30 — End: 2017-02-22

## 2014-12-30 MED ORDER — IOHEXOL 300 MG/ML  SOLN
25.0000 mL | Freq: Once | INTRAMUSCULAR | Status: AC | PRN
  Administered 2014-12-30: 25 mL via ORAL

## 2014-12-30 MED ORDER — ONDANSETRON HCL 4 MG/2ML IJ SOLN
4.0000 mg | Freq: Once | INTRAMUSCULAR | Status: AC
  Administered 2014-12-30: 4 mg via INTRAVENOUS
  Filled 2014-12-30: qty 2

## 2014-12-30 NOTE — ED Notes (Signed)
The pt is c/o abd pain for 2 days with nausea.  lmp one she delivered 4 months ago and is still breast feeding the baby

## 2014-12-30 NOTE — ED Provider Notes (Signed)
CSN: [id number]     Arrival date & time 12/29/14  2337 History  This chart was scribed for Vanetta Mulders, MD by Bronson Curb, ED Scribe. This patient was seen in room A10C/A10C and the patient's care was started at 1:59 AM.   Chief Complaint  Patient presents with  . Abdominal Pain    Patient is a 29 y.o. female presenting with abdominal pain. The history is provided by the patient and the spouse. No language interpreter was used.  Abdominal Pain Pain location:  Epigastric Pain radiates to:  Does not radiate Pain severity now: 10/10. Onset quality:  Gradual Duration:  2 days Timing:  Constant Progression:  Unchanged Chronicity:  New Relieved by:  None tried Worsened by:  Nothing tried Ineffective treatments:  None tried Associated symptoms: dysuria   Associated symptoms: no chest pain, no chills, no cough, no diarrhea, no fever, no nausea, no shortness of breath, no sore throat and no vomiting      HPI Comments: Kelli Griffin is a 29 y.o. female who presents to the Emergency Department complaining of constant, 10/10, non-radiating, epigastric abdominal pain that began 2 days ago (12/28/14). There is associated dysuria. Patient notes eating exacerbates the abdominal pain. She denies any sick contacts. She further denies fever, chills, cough, rhinorrhea, sore throat, SOB, chest pain, leg swelling, visual disturbances, nausea, vomiting, diarrhea, back pain, neck pain, rash, dizziness, headache, or light-headedness.   Past Medical History  Diagnosis Date  . No pertinent past medical history   . Pregnant   . Language barrier 05/12/2012   Past Surgical History  Procedure Laterality Date  . No past surgeries     Family History  Problem Relation Age of Onset  . Other Neg Hx   . Arthritis Neg Hx   . Alcohol abuse Neg Hx   . Asthma Neg Hx   . Birth defects Neg Hx   . Cancer Neg Hx   . COPD Neg Hx   . Depression Neg Hx   . Diabetes Neg Hx   . Drug abuse Neg Hx   . Early  death Neg Hx   . Hearing loss Neg Hx   . Heart disease Neg Hx   . Hyperlipidemia Neg Hx   . Hypertension Neg Hx   . Kidney disease Neg Hx   . Learning disabilities Neg Hx   . Mental illness Neg Hx   . Mental retardation Neg Hx   . Miscarriages / Stillbirths Neg Hx   . Stroke Neg Hx   . Vision loss Neg Hx   . Varicose Veins Neg Hx    History  Substance Use Topics  . Smoking status: Never Smoker   . Smokeless tobacco: Never Used  . Alcohol Use: No   OB History    Gravida Para Term Preterm AB TAB SAB Ectopic Multiple Living   0 0 0 0 0 0 2     Review of Systems  Constitutional: Negative for fever and chills.  HENT: Negative for rhinorrhea and sore throat.   Eyes: Negative for visual disturbance.  Respiratory: Negative for cough and shortness of breath.   Cardiovascular: Negative for chest pain and leg swelling.  Gastrointestinal: Positive for abdominal pain. Negative for nausea, vomiting and diarrhea.  Genitourinary: Positive for dysuria.  Musculoskeletal: Negative for back pain and neck pain.  Skin: Negative for rash.  Neurological: Negative for dizziness, light-headedness and headaches.  Hematological: Does not bruise/bleed easily.  Psychiatric/Behavioral: Negative for confusion.  Allergies  Review of patient's allergies indicates no known allergies.  Home Medications   Prior to Admission medications   Medication Sig Start Date End Date Taking? Authorizing Provider  acetaminophen (TYLENOL) 500 MG tablet Take 500 mg by mouth every 6 (six) hours as needed for moderate pain.   Yes Historical Provider, MD  ibuprofen (ADVIL,MOTRIN) 600 MG tablet Take 1 tablet every 6 hours for the next 2 days, then take if needed every 6 hours after that. Patient taking differently: Take 600 mg by mouth every 6 (six) hours as needed for moderate pain.  10/17/14  Yes Nigel Bridgeman, CNM  ferrous sulfate 325 (65 FE) MG tablet Take 1 tablet (325 mg total) by mouth 2 (two) times daily  with a meal. Patient not taking: Reported on 12/30/2014 09/05/14   Venus Standard, CNM  Prenatal Vit-Fe Fumarate-FA (PRENATAL MULTIVITAMIN) TABS tablet Take 1 tablet by mouth daily at 12 noon.    Historical Provider, MD  promethazine (PHENERGAN) 25 MG tablet Take 1 tablet (25 mg total) by mouth every 6 (six) hours as needed for nausea or vomiting. 12/30/14   Vanetta Mulders, MD   Triage Vitals: BP 129/77 mmHg  Pulse 56  Temp(Src) 98 F (36.7 C) (Oral)  Resp 20  Ht 5\' 1"  (1.549 m)  Wt 140 lb (63.504 kg)  BMI 26.47 kg/m2  SpO2 100%  Physical Exam  Constitutional: She is oriented to person, place, and time. She appears well-developed and well-nourished. No distress.  HENT:  Head: Normocephalic and atraumatic.  Mouth/Throat: Mucous membranes are normal.  Mucous membranes are moist.  Eyes: Conjunctivae and EOM are normal.  Pupils normal. Sclera clear. Eyes track normally.  Neck: Neck supple. No tracheal deviation present.  Cardiovascular: Normal rate, regular rhythm and normal heart sounds.   Pulmonary/Chest: Effort normal and breath sounds normal. No respiratory distress.  Abdominal: Soft. Bowel sounds are normal. There is no tenderness.  Musculoskeletal: Normal range of motion. She exhibits no edema.  No swelling in the ankles.  Neurological: She is alert and oriented to person, place, and time. No cranial nerve deficit. She exhibits normal muscle tone. Coordination normal.  Skin: Skin is warm and dry.  Psychiatric: She has a normal mood and affect. Her behavior is normal.  Nursing note and vitals reviewed.   ED Course  Procedures (including critical care time)  DIAGNOSTIC STUDIES: Oxygen Saturation is 100% on room air, normal by my interpretation.    COORDINATION OF CARE: At 0203 Discussed treatment plan with patient which includes imaging. Patient agrees.   Labs Review Labs Reviewed  CBC WITH DIFFERENTIAL/PLATELET - Abnormal; Notable for the following:    Hemoglobin 9.8  (*)    HCT 32.3 (*)    MCV 66.7 (*)    MCH 20.2 (*)    RDW 16.4 (*)    Neutrophils Relative % 39 (*)    Eosinophils Relative 8 (*)    All other components within normal limits  COMPREHENSIVE METABOLIC PANEL - Abnormal; Notable for the following:    Potassium 3.4 (*)    Glucose, Bld 116 (*)    All other components within normal limits  LIPASE, BLOOD  URINALYSIS, ROUTINE W REFLEX MICROSCOPIC  POC URINE PREG, ED   Results for orders placed or performed during the hospital encounter of 12/30/14  CBC with Differential  Result Value Ref Range   WBC 7.7 4.0 - 10.5 K/uL   RBC 4.84 3.87 - 5.11 MIL/uL   Hemoglobin 9.8 (L) 12.0 - 15.0 g/dL  HCT 32.3 (L) 36.0 - 46.0 %   MCV 66.7 (L) 78.0 - 100.0 fL   MCH 20.2 (L) 26.0 - 34.0 pg   MCHC 30.3 30.0 - 36.0 g/dL   RDW 16.116.4 (H) 09.611.5 - 04.515.5 %   Platelets 227 150 - 400 K/uL   Neutrophils Relative % 39 (L) 43 - 77 %   Lymphocytes Relative 46 12 - 46 %   Monocytes Relative 6 3 - 12 %   Eosinophils Relative 8 (H) 0 - 5 %   Basophils Relative 1 0 - 1 %   Neutro Abs 3.0 1.7 - 7.7 K/uL   Lymphs Abs 3.5 0.7 - 4.0 K/uL   Monocytes Absolute 0.5 0.1 - 1.0 K/uL   Eosinophils Absolute 0.6 0.0 - 0.7 K/uL   Basophils Absolute 0.1 0.0 - 0.1 K/uL   RBC Morphology TARGET CELLS   Comprehensive metabolic panel  Result Value Ref Range   Sodium 140 135 - 145 mmol/L   Potassium 3.4 (L) 3.5 - 5.1 mmol/L   Chloride 108 96 - 112 mmol/L   CO2 22 19 - 32 mmol/L   Glucose, Bld 116 (H) 70 - 99 mg/dL   BUN 14 6 - 23 mg/dL   Creatinine, Ser 4.090.69 0.50 - 1.10 mg/dL   Calcium 8.8 8.4 - 81.110.5 mg/dL   Total Protein 7.2 6.0 - 8.3 g/dL   Albumin 3.8 3.5 - 5.2 g/dL   AST 20 0 - 37 U/L   ALT 11 0 - 35 U/L   Alkaline Phosphatase 108 39 - 117 U/L   Total Bilirubin 0.4 0.3 - 1.2 mg/dL   GFR calc non Af Amer >90 >90 mL/min   GFR calc Af Amer >90 >90 mL/min   Anion gap 10 5 - 15  Lipase, blood  Result Value Ref Range   Lipase 33 11 - 59 U/L  Urinalysis, Routine w reflex  microscopic  Result Value Ref Range   Color, Urine YELLOW YELLOW   APPearance CLEAR CLEAR   Specific Gravity, Urine 1.022 1.005 - 1.030   pH 6.0 5.0 - 8.0   Glucose, UA NEGATIVE NEGATIVE mg/dL   Hgb urine dipstick NEGATIVE NEGATIVE   Bilirubin Urine NEGATIVE NEGATIVE   Ketones, ur NEGATIVE NEGATIVE mg/dL   Protein, ur NEGATIVE NEGATIVE mg/dL   Urobilinogen, UA 0.2 0.0 - 1.0 mg/dL   Nitrite NEGATIVE NEGATIVE   Leukocytes, UA NEGATIVE NEGATIVE  POC Urine Pregnancy, ED  (If Pre-menopausal female) - do not [facility] at [facility]  Result Value Ref Range   Preg Test, Ur NEGATIVE NEGATIVE     Imaging Review Ct Abdomen Pelvis W Contrast  12/30/2014   CLINICAL DATA:  Abdominal pain for 2 days, with nausea  EXAM: CT ABDOMEN AND PELVIS WITH CONTRAST  TECHNIQUE: Multidetector CT imaging of the abdomen and pelvis was performed using the standard protocol following bolus administration of intravenous contrast.  CONTRAST:  80mL OMNIPAQUE IOHEXOL 300 MG/ML  SOLN  COMPARISON:  07/16/2013  FINDINGS: Lower chest:  No significant abnormality  Hepatobiliary: There are normal appearances of the liver and bile ducts. The gallbladder is contracted and cannot be evaluated. The contraction may be physiologic if this is a nonfasting exam.  Pancreas: Normal  Spleen: Normal  Adrenals/Urinary Tract: The adrenals and kidneys are normal in appearance. There is no urinary calculus evident. There is no hydronephrosis or ureteral dilatation. Collecting systems and ureters appear unremarkable.  Stomach/Bowel: There are normal appearances of the stomach, small bowel and colon. There is  no evidence of appendicitis.  Vascular/Lymphatic: The abdominal aorta is normal in caliber. There is no atherosclerotic calcification. There is no adenopathy in the abdomen or pelvis.  Reproductive: The uterus and ovaries are normal in appearance.  Other: There is scant volume free fluid in the pelvic cul-de-sac.  Musculoskeletal: No significant abnormality   IMPRESSION: Scant volume free pelvic fluid. Contracted gallbladder. Both of these could be physiologic. Otherwise unremarkable.   Electronically Signed   By: Ellery Plunk M.D.   On: 12/30/2014 06:08     EKG Interpretation None      MDM   Final diagnoses:  Abdominal pain    Workup for the epigastric abdominal pain and nausea without significant findings. No sniffing and lab abnormalities. Liver function tests normal eye paced normal not consistent with pancreatitis. CT scan negative. Urinalysis is negative. Pregnancy test negative. Patient is currently breast-feeding. Will discharge home with the Phenergan and follow-up.  I personally performed the services described in this documentation, which was scribed in my presence. The recorded information has been reviewed and is accurate.     Vanetta Mulders, MD 12/30/14 [phone]

## 2014-12-30 NOTE — Discharge Instructions (Signed)
Workup for the epigastric abdominal pain with nausea is negative. Patient is still breast-feeding. So medication treatments are limited. Will treat with Phenergan. Resource guide provided below would recommend following up with the wellness clinic. Return for any new or worse symptoms.    Emergency Department Resource Guide 1) Find a Doctor and Pay Out of Pocket Although you won't have to find out who is covered by your insurance plan, it is a good idea to ask around and get recommendations. You will then need to call the office and see if the doctor you have chosen will accept you as a new patient and what types of options they offer for patients who are self-pay. Some doctors offer discounts or will set up payment plans for their patients who do not have insurance, but you will need to ask so you aren't surprised when you get to your appointment.  2) Contact Your Local Health Department Not all health departments have doctors that can see patients for sick visits, but many do, so it is worth a call to see if yours does. If you don't know where your local health department is, you can check in your phone book. The CDC also has a tool to help you locate your state's health department, and many state websites also have listings of all of their local health departments.  3) Find a Walk-in Clinic If your illness is not likely to be very severe or complicated, you may want to try a walk in clinic. These are popping up all over the country in pharmacies, drugstores, and shopping centers. They're usually staffed by nurse practitioners or physician assistants that have been trained to treat common illnesses and complaints. They're usually fairly quick and inexpensive. However, if you have serious medical issues or chronic medical problems, these are probably not your best option.  No Primary Care Doctor: - Call Health Connect at  [phone] - they can help you locate a primary care doctor that  accepts your  insurance, provides certain services, etc. - Physician Referral Service- [phone]  Chronic Pain Problems: Organization         Address  Phone   Notes  [facility]  [phone] Patients need to be referred by their primary care doctor.   Medication Assistance: Organization         Address  Phone   Notes  [facility] Medication [facility] [address] [place], [place] [address] [phone] --Must be a resident of [facility] -- Must have NO insurance coverage whatsoever (no Medicaid/ Medicare, etc.) -- The pt. MUST have a primary care doctor that directs their care regularly and follows them in the community   MedAssist  [phone]   [employer]  [phone]    Agencies that provide inexpensive medical care: Organization         Address  Phone   Notes  [facility]  [phone]   [facility]    [phone]   [facility] [address] [place], [place] [address] [phone]   [facility] [address], [place] [phone]   [facility]anned [facility]    [phone]   [facility]    [phone]   [facility]  [address],  Phone:  [phone], Fax:  [phone] Hours of Operation:  9 am - 6 pm, M-F.  Also  accepts Medicaid/Medicare and self-pay.  [facility] for [place], [address], [place] Phone: [phone], Fax: [phone]. Hours of Operation:  8:30 am - 5:30 pm, M-F.  Also accepts Medicaid and self-pay.  [facility] [place] [address] [place] Phone: [phone]   [place], [place], [place] [phone], Ext. [phone] Mondays & Thursdays: 7-9 AM.  First 15 patients are seen on a first come, first serve basis.    [place] Providers:  Organization          Address  Phone   Notes  [facility] [address], [place] [phone] Also accepts self-pay patients.  [facility] [id number] [place], [place]  [phone]   [place], [address], [place] [phone]   [facility] [address], [place] [phone]   Lucianne Lei [address], [place]   [phone] Only accepts [place] Access [place] patients after they have their name applied to their card.   Self-Pay (no insurance) in [facility]:  Organization         Address  Phone   Notes  Sickle Cell Patients, [facility] [place] [phone]   [facility] [place] [phone]   [facility] [place]  [place], [place], [place] [phone]   [facility]/Dr. Osei-Bonsu  [address] [place] or [address], [place] [phone] Phone number for both [place] and [place] locations is the same.  [facility] [address] [place] [phone]   [facility] [address], [place] or [address] [phone]   [facility] [address] [place] [phone], phone; [phone], fax Sees patients 1st and 3rd Saturday of every month.  Must not qualify for public or private insurance (i.e. Medicaid, Medicare, [place] Health Choice, Veterans' Benefits)  Household income should be no more than 200% of the poverty level The clinic cannot treat you if you are pregnant or think you are pregnant  Sexually transmitted diseases are not treated at the clinic.    Dental Care: Organization         Address  Phone  Notes  [facility] Department of [facility] [phone] Accepts children up to age 12 who are enrolled in [place] or [place]; pregnant women with a Medicaid card; and children who have applied for Medicaid or [place] Health Choice, but were declined, whose parents can pay a reduced fee at time of service.  [facility] of [facility]  [address], [place] [phone] Accepts children up to age 44 who are enrolled in [place] or [place]; pregnant women with a Medicaid card; and children who have applied for Medicaid or [place] Health Choice, but were declined, whose parents can pay a reduced fee at time of service.  [facility] Access PROGRAM  [place] [phone] Patients are seen by appointment only. Walk-ins are not accepted. [place] will see patients 60 years of age and older. Monday - Tuesday (8am-5pm)  Most Wednesdays (8:30-5pm) $30 per visit, cash only  [facility] Adult [employer] PROGRAM  [address], [facility] [phone] Patients are seen by appointment only. Walk-ins are not accepted. [place] will see patients 54 years of age and older. One Wednesday Evening (Monthly: Volunteer Based).  $30 per visit, cash only  [place]  [phone] for adults; Children under age 44, call Graduate Pediatric Dentistry at [phone]. Children aged 70-14, please call [phone] to request a pediatric application.  Dental services are provided in all areas of dental care including fillings, crowns and bridges, complete and partial dentures, implants, gum treatment, root canals, and extractions. Preventive care is also provided. Treatment is provided to both adults and children. Patients are selected via a lottery and there is often a waiting list.   [facility] [address] [place]  [phone] [email or web address]   [facility] [address] [place], [place]  [phone], Ext. [phone] Second and Fourth Thursday of each month, opens at 6:30 AM; Clinic ends at 9 AM.  Patients are seen on a first-come first-served basis, and a limited number are seen during each clinic.   [facility]  [address] Hillard Danker [place], [place] [phone]   Eligibility Requirements You must have lived in [place], [place], or [place] counties for at least the last three months.   You cannot be eligible for state or federal sponsored [employer], including [employer], [place], or [employer].   You generally cannot be eligible for healthcare insurance through your employer.    How to apply: Eligibility screenings are held every Tuesday and Wednesday afternoon from 1:00 pm until 4:00 pm. You do not need an appointment for the interview!  [facility] [address], [place], [place]   [place]  [place] Department  [place]  [phone]    Behavioral Health Resources in the Community: Intensive Outpatient Programs Organization         Address  Phone  Notes  [place]. [address], [place], [place] [phone]   [facility] Outpatient [address] [place], [place]   ADS: Alcohol & Drug Svcs [address], [place], [place]   [place] [address],  [place], [place] or [phone]   Substance Abuse Resources Organization         Address  Phone  Notes  Alcohol and Drug Services  [phone]   [place]  [phone]   [facility] [place]   Chinita Pester  [phone]   Residential & Outpatient Substance Abuse Program  [phone]   Psychological Services Organization         Address  Phone  Notes  [facility]  [place]  [phone]    [place] [address] [place] or [phone]    Mobile Crisis Teams Organization         Address  Phone  Notes  [facility], Mobile Crisis Care Unit  [phone]   [facility] Psychotherapeutic Services  [address] [place], [place]   [place] Levels [address], [place] [phone]    Self-Help/Support Groups Organization         Address  Phone  Notes  [facility] of [place] - variety of support groups  [place] Call for more information  Narcotics Anonymous (NA), [facility]aring [facility] [address], [employer] [place]  2 meetings at this location   Special educational needs teacher         Address  Phone  Notes  ASAP Residential Treatment [place],    [place]  [phone]   [facility]  [address], [place] 426834, [place], [place]   [place], [place] [phone] Admissions: 8am-3pm M-F  Incentives Substance [place] [address],    [place], [place] [phone]   [facility] [address] [place], [place], [place]   [facility] [address]  [place], [place]   [facility]ight Programs - Intensive Outpatient [address]een [address], [place], [place]   [facility] ([place].,  [place], [place] [phone] or [phone]   [facility] Services (RTS) [address] [place], [place] Accepts Medicaid  [facility] [address],  [place] [phone] Substance Abuse/Addiction Treatment   [facility] Organization         Address  Phone  Notes  [facility]  [phone]   Domenic Schwab, PhD [address] Arlis Porta [place], [place]   [phone] or [phone]   [place]  [place], [place] [phone]   [facility] [address], [place], [place] [phone] Insurance/Medicaid/sponsorship through [facility] and Families [address], [address] [place]                                    [place], [place] [phone] [place] [address], [place] [phone]    Dr. Adele Schilder  [phone]   [facility] Dept. 1) [address] [place] 2) [place] 3)  [place], [place] [phone]  [phone]   [place] [phone] or [phone] (After Hours)

## 2015-06-30 IMAGING — CT CT ABD-PELV W/ CM
2 of 4 series · 14 of 46 positions shown, 16 images · IV contrast (omnipaque)
Comparison: 07/16/2013

CLINICAL DATA: Abdominal pain for 2 days, with nausea

EXAM:
CT ABDOMEN AND PELVIS WITH CONTRAST
TECHNIQUE: Multidetector CT imaging of the abdomen and pelvis was performed
using the standard protocol following bolus administration of
intravenous contrast.
CONTRAST:  80mL OMNIPAQUE IOHEXOL 300 MG/ML  SOLN

[Series 201: routine, idose (2) · axial · 0.62mm/px · z∈[+91,+451]mm · 11 of 86 slices shown, 13 images]
[im 7/86  soft-tissue]
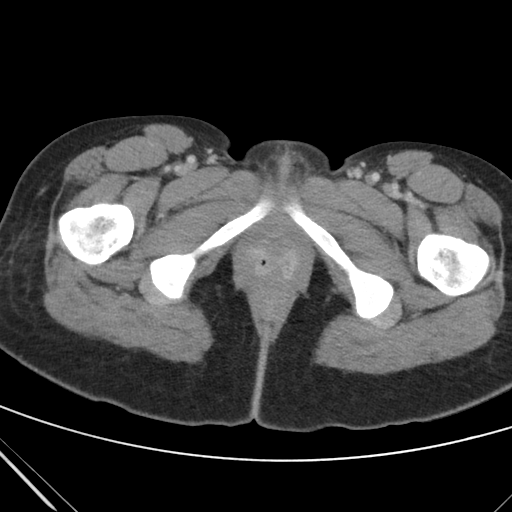
[im 7/86  bone]
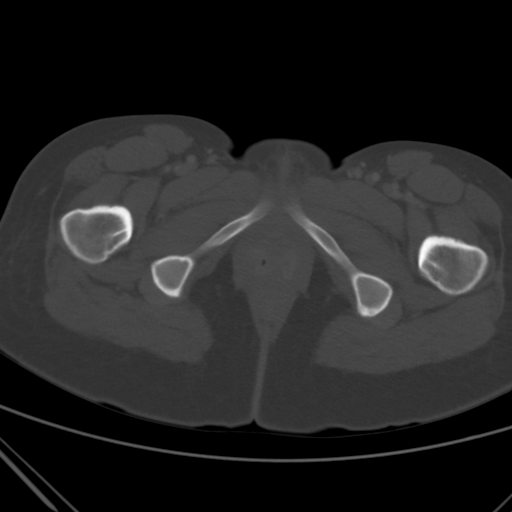
[im 14/86  soft-tissue]
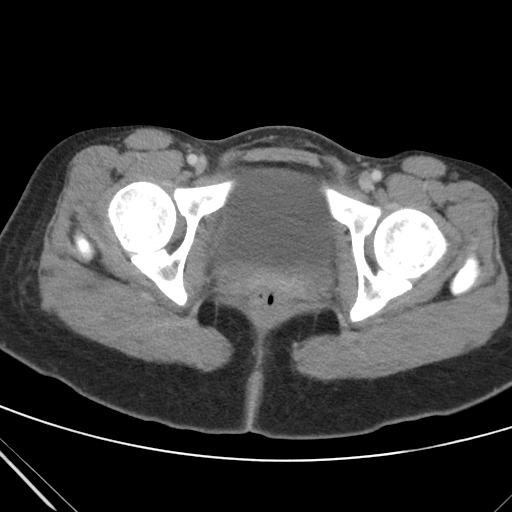
[im 21/86  soft-tissue]
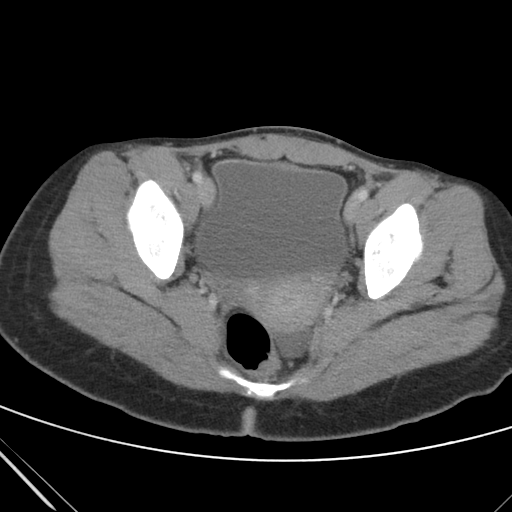
[im 28/86  soft-tissue]
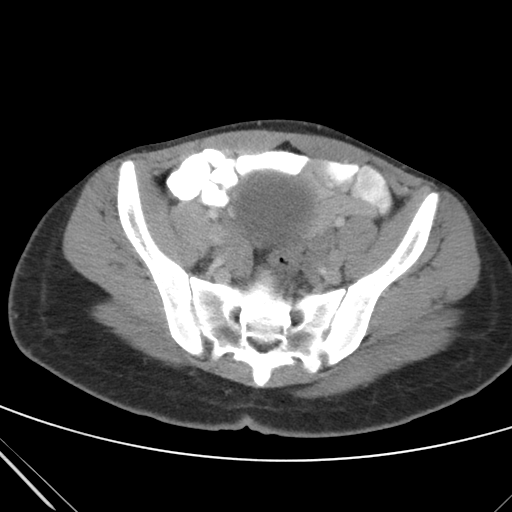
[im 35/86  soft-tissue]
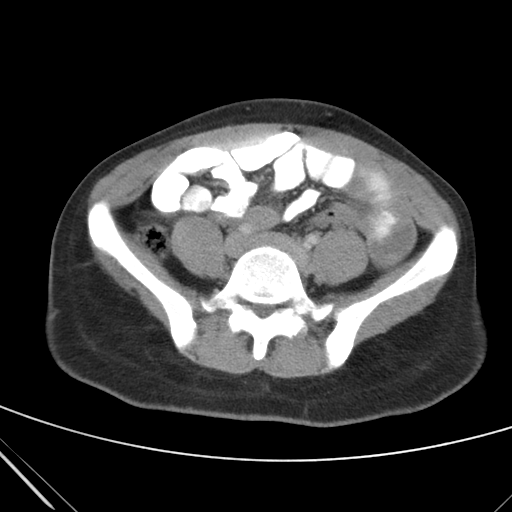
[im 45/86  soft-tissue]
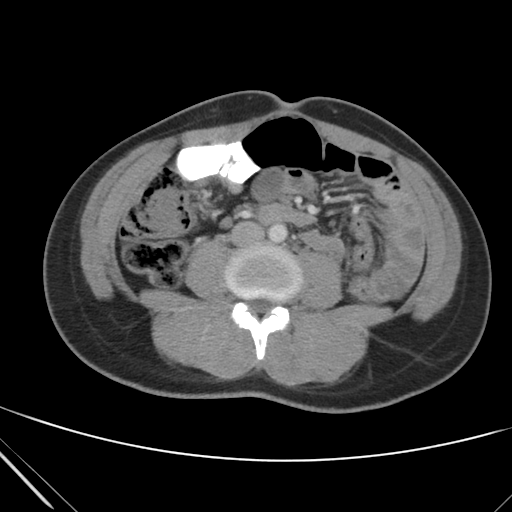
[im 52/86  soft-tissue]
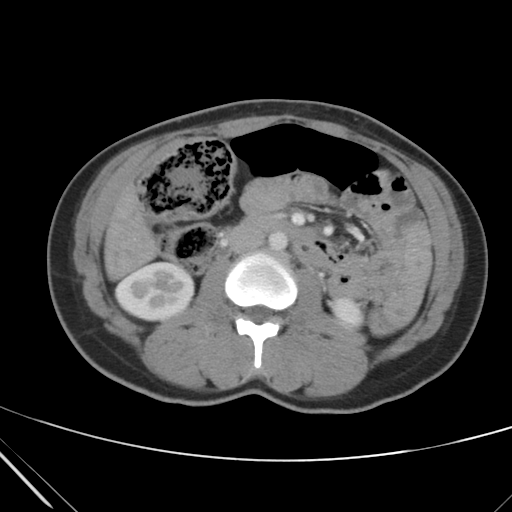
[im 58/86  soft-tissue]
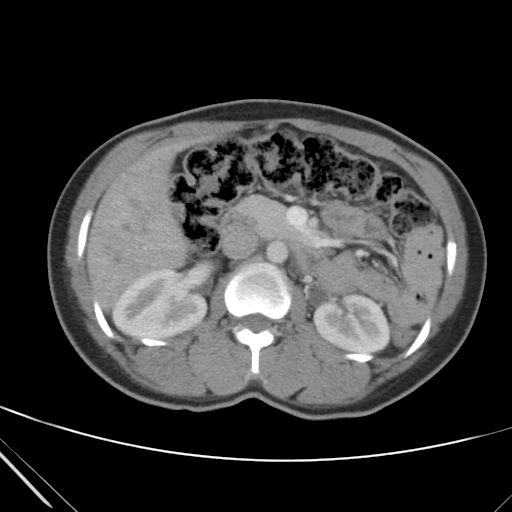
[im 65/86  soft-tissue]
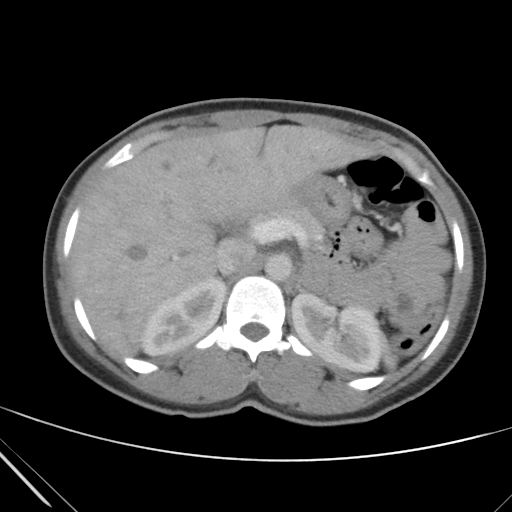
[im 65/86  bone]
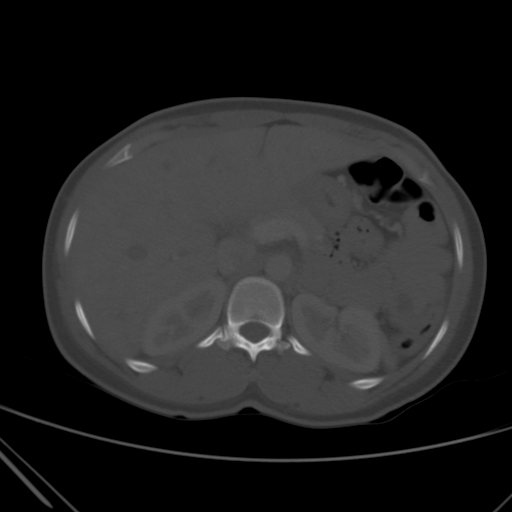
[im 72/86  soft-tissue]
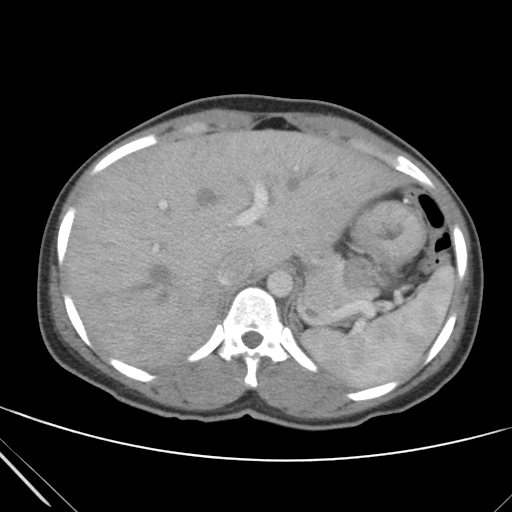
[im 79/86  soft-tissue]
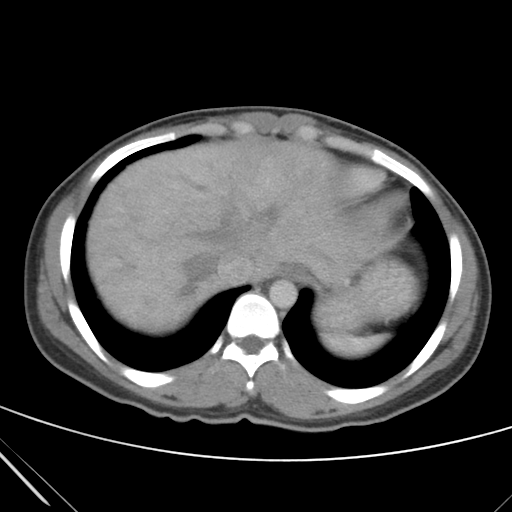

[Series 202: coronals, idose (2) · coronal · 0.50mm/px · 3 of 115 slices shown]
[im 39/115  soft-tissue]
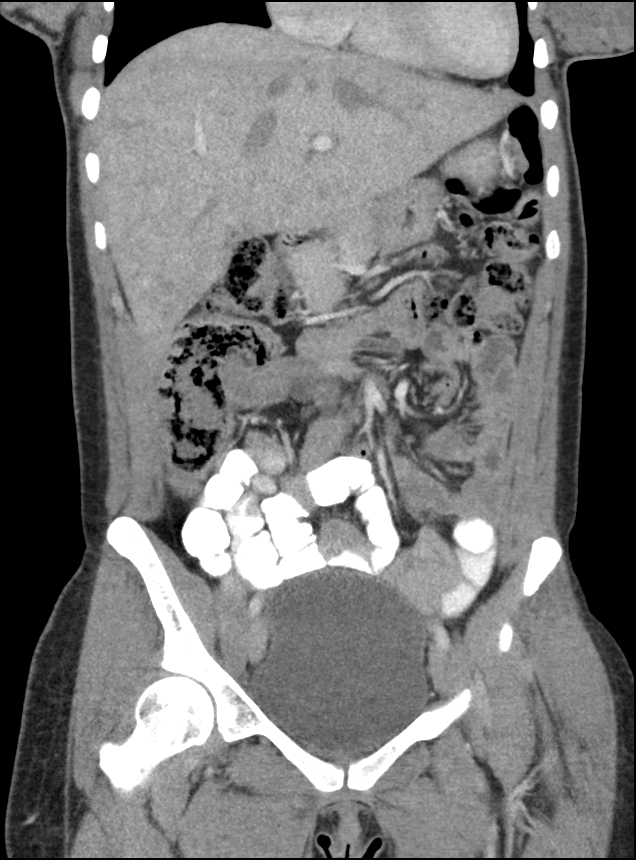
[im 51/115  soft-tissue]
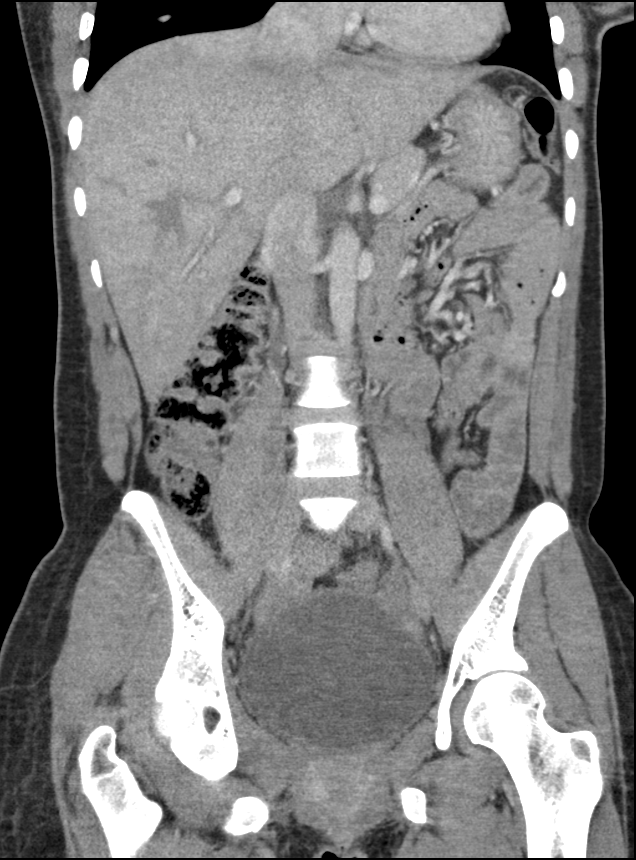
[im 64/115  soft-tissue]
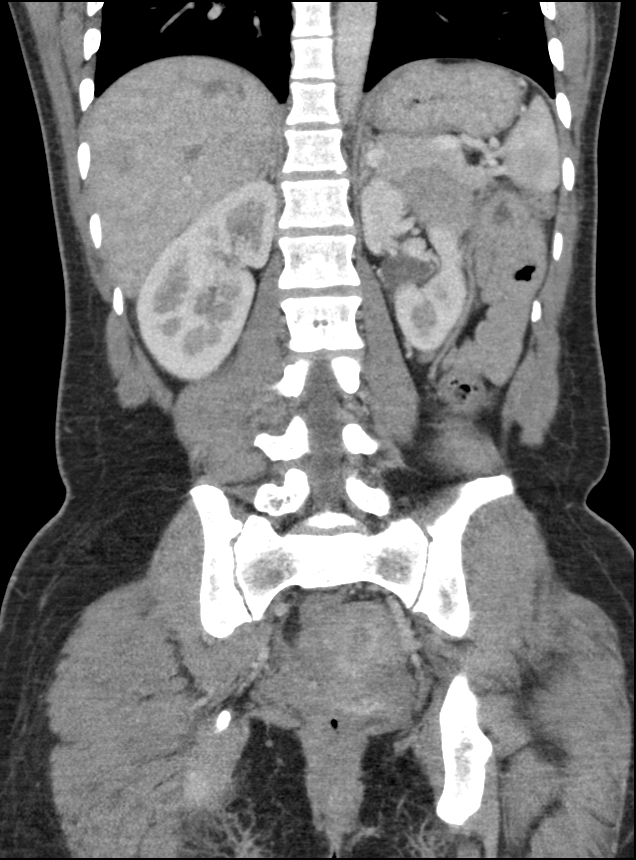

[14 of 46 positions shown; findings below may reference images not displayed]

FINDINGS: Lower chest:  No significant abnormality

Hepatobiliary: There are normal appearances of the liver and bile
ducts. The gallbladder is contracted and cannot be evaluated. The
contraction may be physiologic if this is a nonfasting exam.

Pancreas: Normal

Spleen: Normal

Adrenals/Urinary Tract: The adrenals and kidneys are normal in
appearance. There is no urinary calculus evident. There is no
hydronephrosis or ureteral dilatation. Collecting systems and
ureters appear unremarkable.

Stomach/Bowel: There are normal appearances of the stomach, small
bowel and colon. There is no evidence of appendicitis.

Vascular/Lymphatic: The abdominal aorta is normal in caliber. There
is no atherosclerotic calcification. There is no adenopathy in the
abdomen or pelvis.

Reproductive: The uterus and ovaries are normal in appearance.

Other: There is scant volume free fluid in the pelvic cul-de-sac.

Musculoskeletal: No significant abnormality
IMPRESSION: Scant volume free pelvic fluid. Contracted gallbladder. Both of
these could be physiologic. Otherwise unremarkable.

## 2015-08-08 ENCOUNTER — Encounter (HOSPITAL_COMMUNITY): Payer: Self-pay | Admitting: Emergency Medicine

## 2015-08-08 ENCOUNTER — Emergency Department (HOSPITAL_COMMUNITY)
Admission: EM | Admit: 2015-08-08 | Discharge: 2015-08-08 | Disposition: A | Discharge status: Home / Self Care | Payer: 59 | Attending: Emergency Medicine | Admitting: Emergency Medicine

## 2015-08-08 DIAGNOSIS — R112 Nausea with vomiting, unspecified: Secondary | ICD-10-CM | POA: Diagnosis not present

## 2015-08-08 DIAGNOSIS — R1033 Periumbilical pain: Secondary | ICD-10-CM | POA: Insufficient documentation

## 2015-08-08 DIAGNOSIS — R197 Diarrhea, unspecified: Secondary | ICD-10-CM | POA: Insufficient documentation

## 2015-08-08 DIAGNOSIS — Z3202 Encounter for pregnancy test, result negative: Secondary | ICD-10-CM | POA: Insufficient documentation

## 2015-08-08 LAB — COMPREHENSIVE METABOLIC PANEL
ALK PHOS: 72 U/L (ref 38–126)
ALT: 15 U/L (ref 14–54)
AST: 26 U/L (ref 15–41)
Albumin: 3.5 g/dL (ref 3.5–5.0)
Anion gap: 7 (ref 5–15)
BUN: 9 mg/dL (ref 6–20)
CALCIUM: 8 mg/dL — AB (ref 8.9–10.3)
CO2: 23 mmol/L (ref 22–32)
CREATININE: 0.62 mg/dL (ref 0.44–1.00)
Chloride: 105 mmol/L (ref 101–111)
GFR calc Af Amer: >60 mL/min (ref >60)
Glucose, Bld: 87 mg/dL (ref 65–99)
Potassium: 3.6 mmol/L (ref 3.5–5.1)
Sodium: 135 mmol/L (ref 135–145)
Total Bilirubin: 0.3 mg/dL (ref 0.3–1.2)
Total Protein: 6.5 g/dL (ref 6.5–8.1)

## 2015-08-08 LAB — URINALYSIS, ROUTINE W REFLEX MICROSCOPIC
Bilirubin Urine: NEGATIVE
Glucose, UA: NEGATIVE mg/dL
Hgb urine dipstick: NEGATIVE
Ketones, ur: NEGATIVE mg/dL
Leukocytes, UA: NEGATIVE
NITRITE: NEGATIVE
Protein, ur: NEGATIVE mg/dL
Specific Gravity, Urine: 1.021 (ref 1.005–1.030)
Urobilinogen, UA: 0.2 mg/dL (ref 0.0–1.0)
pH: 6 (ref 5.0–8.0)

## 2015-08-08 LAB — LIPASE, BLOOD: Lipase: 25 U/L (ref 11–51)

## 2015-08-08 LAB — POC URINE PREG, ED: Preg Test, Ur: NEGATIVE

## 2015-08-08 LAB — CBC WITH DIFFERENTIAL/PLATELET
Basophils Absolute: 0.1 10*3/uL (ref 0.0–0.1)
Basophils Relative: 1 %
EOS PCT: 5 %
Eosinophils Absolute: 0.3 10*3/uL (ref 0.0–0.7)
HEMATOCRIT: 34.9 % — AB (ref 36.0–46.0)
Hemoglobin: 11 g/dL — ABNORMAL LOW (ref 12.0–15.0)
Lymphocytes Relative: 40 %
Lymphs Abs: 2 10*3/uL (ref 0.7–4.0)
MCH: 22 pg — ABNORMAL LOW (ref 26.0–34.0)
MCHC: 31.5 g/dL (ref 30.0–36.0)
MCV: 69.8 fL — AB (ref 78.0–100.0)
MONOS PCT: 6 %
Monocytes Absolute: 0.3 10*3/uL (ref 0.1–1.0)
NEUTROS ABS: 2.3 10*3/uL (ref 1.7–7.7)
Neutrophils Relative %: 48 %
Platelets: 253 10*3/uL (ref 150–400)
RBC: 5 MIL/uL (ref 3.87–5.11)
RDW: 14.8 % (ref 11.5–15.5)
WBC: 5 10*3/uL (ref 4.0–10.5)

## 2015-08-08 MED ORDER — ONDANSETRON HCL 4 MG PO TABS: 4.0000 mg | ORAL_TABLET | Freq: Three times a day (TID) | ORAL | Status: DC | PRN

## 2015-08-08 MED ORDER — ONDANSETRON HCL 4 MG/2ML IJ SOLN: 4.0000 mg | Freq: Once | INTRAMUSCULAR | Status: DC

## 2015-08-08 MED ORDER — SODIUM CHLORIDE 0.9 % IV BOLUS (SEPSIS)
1000.0000 mL | Freq: Once | INTRAVENOUS | Status: AC
  Administered 2015-08-08: 1000 mL via INTRAVENOUS

## 2015-08-08 NOTE — ED Notes (Signed)
Pt reports she has some vomiting and diarrhea since last eating on Sunday. Diarrhea stopped on Monday. Pt has not eaten since then, but has been able to keep down water. Pt also reports tenderness to all quadrants upon palpation. LMP 10/22. 1 episode of vomiting the last 24 hours.

## 2015-08-08 NOTE — Discharge Instructions (Signed)
Make sure that you return to the emergency department if your pain worsens in severity, you develop a fever 100.5 or greater, you are unable to have a bowel movement.    Abdominal Pain, Adult Many things can cause belly (abdominal) pain. Most times, the belly pain is not dangerous. Many cases of belly pain can be watched and treated at home. HOME CARE   Do not take medicines that help you go poop (laxatives) unless told to by your doctor.  Only take medicine as told by your doctor.  Eat or drink as told by your doctor. Your doctor will tell you if you should be on a special diet. GET HELP IF:  You do not know what is causing your belly pain.  You have belly pain while you are sick to your stomach (nauseous) or have runny poop (diarrhea).  You have pain while you pee or poop.  Your belly pain wakes you up at night.  You have belly pain that gets worse or better when you eat.  You have belly pain that gets worse when you eat fatty foods.  You have a fever. GET HELP RIGHT AWAY IF:   The pain does not go away within 2 hours.  You keep throwing up (vomiting).  The pain changes and is only in the right or left part of the belly.  You have bloody or tarry looking poop. MAKE SURE YOU:   Understand these instructions.  Will watch your condition.  Will get help right away if you are not doing well or get worse.   This information is not intended to replace advice given to you by your health care provider. Make sure you discuss any questions you have with your health care provider.   Document Released: 03/17/2008 Document Revised: 10/20/2014 Document Reviewed: 06/08/2013 Elsevier Interactive Patient Education [employer]2016 Elsevier Inc.

## 2015-08-08 NOTE — ED Provider Notes (Signed)
CSN: [id number]     Arrival date & time 08/08/15  [id number] History   First MD Initiated Contact with Patient 08/08/15 0747     Chief Complaint  Patient presents with  . Abdominal Pain  . Diarrhea  . Emesis     (Consider location/radiation/quality/duration/timing/severity/associated sxs/prior Treatment) Patient is a 29 y.o. female presenting with abdominal pain, diarrhea, and vomiting. The history is provided by the patient and a relative.  Abdominal Pain Pain location:  Periumbilical Pain quality: sharp   Pain radiates to:  Does not radiate Pain severity:  Mild Onset quality:  Gradual Timing:  Constant Progression:  Unchanged Chronicity:  New Relieved by:  None tried Worsened by:  Nothing tried Ineffective treatments:  None tried Associated symptoms: diarrhea, nausea and vomiting   Associated symptoms: no chest pain, no chills, no constipation, no cough, no dysuria, no fever and no shortness of breath   Diarrhea:    Quality:  Watery   Severity:  Mild   Progression:  Resolved Risk factors: not pregnant   Diarrhea Associated symptoms: abdominal pain and vomiting   Associated symptoms: no chills and no fever   Emesis Associated symptoms: abdominal pain and diarrhea   Associated symptoms: no chills    Ms. Kelli Griffin is a 29 yo F that is presenting with periumbilical pain.  The pain started on Sunday and has remain unchanged. She has had one episode of diarrhea on Monday and one episode of emesis that was yesterday. The pain is sharp in nature. She hasn't tried anything for the pain. Nothing seems to make the pain worse. She denies any history of STD, dysuria, or vaginal discharge. No fevers or chills. She denies any travel, alcohol use, tobacco use or illicit drug use. Her LMP was over the weekend and denies any possibility of STD. Denies any sick contacts.    Past Medical History  Diagnosis Date  . No pertinent past medical history   . Pregnant   . Language barrier 05/12/2012    Past Surgical History  Procedure Laterality Date  . No past surgeries     Family History  Problem Relation Age of Onset  . Other Neg Hx   . Arthritis Neg Hx   . Alcohol abuse Neg Hx   . Asthma Neg Hx   . Birth defects Neg Hx   . Cancer Neg Hx   . COPD Neg Hx   . Depression Neg Hx   . Diabetes Neg Hx   . Drug abuse Neg Hx   . Early death Neg Hx   . Hearing loss Neg Hx   . Heart disease Neg Hx   . Hyperlipidemia Neg Hx   . Hypertension Neg Hx   . Kidney disease Neg Hx   . Learning disabilities Neg Hx   . Mental illness Neg Hx   . Mental retardation Neg Hx   . Miscarriages / Stillbirths Neg Hx   . Stroke Neg Hx   . Vision loss Neg Hx   . Varicose Veins Neg Hx    Social History  Substance Use Topics  . Smoking status: Never Smoker   . Smokeless tobacco: Never Used  . Alcohol Use: No   OB History    Gravida Para Term Preterm AB TAB SAB Ectopic Multiple Living   2 2 2  0 0 0 0 0 0 2     Review of Systems  Constitutional: Negative for fever and chills.  Respiratory: Negative for cough and shortness of breath.  Cardiovascular: Negative for chest pain.  Gastrointestinal: Positive for nausea, vomiting, abdominal pain and diarrhea. Negative for constipation.  Genitourinary: Negative for dysuria.  Musculoskeletal: Negative for back pain.  Skin: Negative for rash.  Neurological: Negative for light-headedness.  Psychiatric/Behavioral: Negative for behavioral problems.      Allergies  Review of patient's allergies indicates no known allergies.  Home Medications   Prior to Admission medications   Medication Sig Start Date End Date Taking? Authorizing Provider  acetaminophen (TYLENOL) 500 MG tablet Take 500 mg by mouth every 6 (six) hours as needed for moderate pain.    Historical Provider, MD  ferrous sulfate 325 (65 FE) MG tablet Take 1 tablet (325 mg total) by mouth 2 (two) times daily with a meal. Patient not taking: Reported on 12/30/2014 09/05/14   Venus  Standard, CNM  ibuprofen (ADVIL,MOTRIN) 600 MG tablet Take 1 tablet every 6 hours for the next 2 days, then take if needed every 6 hours after that. Patient taking differently: Take 600 mg by mouth every 6 (six) hours as needed for moderate pain.  10/17/14   Nigel Bridgeman, CNM  ondansetron (ZOFRAN) 4 MG tablet Take 1 tablet (4 mg total) by mouth every 8 (eight) hours as needed for nausea or vomiting. 08/08/15   Myra Rude, MD  Prenatal Vit-Fe Fumarate-FA (PRENATAL MULTIVITAMIN) TABS tablet Take 1 tablet by mouth daily at 12 noon.    Historical Provider, MD  promethazine (PHENERGAN) 25 MG tablet Take 1 tablet (25 mg total) by mouth every 6 (six) hours as needed for nausea or vomiting. 12/30/14   Vanetta Mulders, MD   BP 126/79 mmHg  Pulse 77  Temp(Src) 98 F (36.7 C) (Oral)  Resp 16  SpO2 100%  LMP 08/04/2015 Physical Exam  Constitutional: She is oriented to person, place, and time. She appears well-developed and well-nourished.  HENT:  Head: Normocephalic and atraumatic.  Mouth/Throat: Uvula is midline. Mucous membranes are dry.  Eyes: Conjunctivae and EOM are normal.  Neck: Normal range of motion. Neck supple.  Cardiovascular: Normal rate, regular rhythm, normal heart sounds and intact distal pulses.   No murmur heard. Pulmonary/Chest: Effort normal and breath sounds normal. No respiratory distress. She has no wheezes.  Abdominal: Soft. Bowel sounds are normal. She exhibits no distension. There is no hepatosplenomegaly. There is tenderness in the periumbilical area. There is no rebound and no CVA tenderness. No hernia.  Musculoskeletal: Normal range of motion. She exhibits no edema.  Neurological: She is alert and oriented to person, place, and time.  Skin: Skin is warm. No rash noted.  Psychiatric: She has a normal mood and affect.    ED Course  Procedures (including critical care time) Labs Review Labs Reviewed  COMPREHENSIVE METABOLIC PANEL - Abnormal; Notable for the  following:    Calcium 8.0 (*)    All other components within normal limits  CBC WITH DIFFERENTIAL/PLATELET - Abnormal; Notable for the following:    Hemoglobin 11.0 (*)    HCT 34.9 (*)    MCV 69.8 (*)    MCH 22.0 (*)    All other components within normal limits  LIPASE, BLOOD  URINALYSIS, ROUTINE W REFLEX MICROSCOPIC (NOT AT [facility])  POC URINE PREG, ED    Imaging Review No results found. I have personally reviewed and evaluated these images and lab results as part of my medical decision-making.   EKG Interpretation None       Medications  ondansetron (ZOFRAN) injection 4 mg (4 mg Intravenous Not Given  08/08/15 0931)  sodium chloride 0.9 % bolus 1,000 mL (0 mLs Intravenous Stopped 08/08/15 0931)    MDM   Final diagnoses:  Periumbilical abdominal pain   Ms. Morrish is a 29 yo F is presenting with abdominal pain. She had associated emesis and diarrhea.  No episodes of diarrhea or emesis while in the ED and improvement of her pain. Symptoms most likely 2/2 viral gastroenteritis. Lab work is reassuring. Provided zofran #12 upon discharge. Given return precautions. Patient is stable for discharge.   Myra Rude, MD PGY-3, [facility] 08/08/2015, 9:32 AM     Myra Rude, MD 08/08/15 1610  Tilden Fossa, MD 08/09/15 [phone]

## 2016-02-20 ENCOUNTER — Encounter (HOSPITAL_COMMUNITY): Payer: Self-pay | Admitting: *Deleted

## 2016-02-20 ENCOUNTER — Emergency Department (HOSPITAL_COMMUNITY)
Admission: EM | Admit: 2016-02-20 | Discharge: 2016-02-20 | Disposition: A | Discharge status: Home / Self Care | Payer: BLUE CROSS/BLUE SHIELD | Attending: Emergency Medicine | Admitting: Emergency Medicine

## 2016-02-20 DIAGNOSIS — Z79899 Other long term (current) drug therapy: Secondary | ICD-10-CM | POA: Diagnosis not present

## 2016-02-20 DIAGNOSIS — H9202 Otalgia, left ear: Secondary | ICD-10-CM | POA: Diagnosis present

## 2016-02-20 DIAGNOSIS — H65192 Other acute nonsuppurative otitis media, left ear: Secondary | ICD-10-CM | POA: Diagnosis not present

## 2016-02-20 DIAGNOSIS — H6692 Otitis media, unspecified, left ear: Secondary | ICD-10-CM

## 2016-02-20 MED ORDER — AMOXICILLIN 500 MG PO CAPS: 500.0000 mg | ORAL_CAPSULE | Freq: Two times a day (BID) | ORAL | Status: DC

## 2016-02-20 NOTE — Discharge Instructions (Signed)
Take antibiotics as prescribed. You may continue taking ibuprofen as needed for pain. Return to the ER for new or worsening symptoms.   Otitis Media, Adult Otitis media is redness, soreness, and inflammation of the middle ear. Otitis media may be caused by allergies or, most commonly, by infection. Often it occurs as a complication of the common cold. SIGNS AND SYMPTOMS Symptoms of otitis media may include:  Earache.  Fever.  Ringing in your ear.  Headache.  Leakage of fluid from the ear. DIAGNOSIS To diagnose otitis media, your health care provider will examine your ear with an otoscope. This is an instrument that allows your health care provider to see into your ear in order to examine your eardrum. Your health care provider also will ask you questions about your symptoms. TREATMENT  Typically, otitis media resolves on its own within 3-5 days. Your health care provider may prescribe medicine to ease your symptoms of pain. If otitis media does not resolve within 5 days or is recurrent, your health care provider may prescribe antibiotic medicines if he or she suspects that a bacterial infection is the cause. HOME CARE INSTRUCTIONS   If you were prescribed an antibiotic medicine, finish it all even if you start to feel better.  Take medicines only as directed by your health care provider.  Keep all follow-up visits as directed by your health care provider. SEEK MEDICAL CARE IF:  You have otitis media only in one ear, or bleeding from your nose, or both.  You notice a lump on your neck.  You are not getting better in 3-5 days.  You feel worse instead of better. SEEK IMMEDIATE MEDICAL CARE IF:   You have pain that is not controlled with medicine.  You have swelling, redness, or pain around your ear or stiffness in your neck.  You notice that part of your face is paralyzed.  You notice that the bone behind your ear (mastoid) is tender when you touch it. MAKE SURE YOU:    Understand these instructions.  Will watch your condition.  Will get help right away if you are not doing well or get worse.   This information is not intended to replace advice given to you by your health care provider. Make sure you discuss any questions you have with your health care provider.   Document Released: 07/04/2004 Document Revised: 10/20/2014 Document Reviewed: 04/26/2013 Elsevier Interactive Patient Education [employer] Elsevier Inc.

## 2016-02-20 NOTE — ED Notes (Signed)
Pt reports initially having a cough and then began having fluid buildup to left ear, difficulty hearing.

## 2016-02-20 NOTE — ED Provider Notes (Signed)
CSN: [id number]     Arrival date & time 02/20/16  1145 History  By signing my name below, I, Kelli Griffin, attest that this documentation has been prepared under the direction and in the presence of Jeananne Bedwell, PA-C. Electronically Signed: Angelene GiovanniEmmanuella Griffin, ED Scribe. 02/20/2016. 12:36 PM.    Chief Complaint  Patient presents with  . Otalgia    The history is provided by the patient. No language interpreter was used.   HPI Comments: Kelli Griffin is a 30 y.o. female who presents to the Emergency Department complaining of gradually worsening left ear heaviness and discomfort onset one week ago. Pt explains that her symptoms started as a cough that has since resolved. She reports associated feeling of fluid in her ears. No alleviating factors noted. Pt tried Ibuprofen PTA with mild relief. No sick contacts noted. She denies any fever, chills, or n/v.    Past Medical History  Diagnosis Date  . No pertinent past medical history   . Pregnant   . Language barrier 05/12/2012   Past Surgical History  Procedure Laterality Date  . No past surgeries     Family History  Problem Relation Age of Onset  . Other Neg Hx   . Arthritis Neg Hx   . Alcohol abuse Neg Hx   . Asthma Neg Hx   . Birth defects Neg Hx   . Cancer Neg Hx   . COPD Neg Hx   . Depression Neg Hx   . Diabetes Neg Hx   . Drug abuse Neg Hx   . Early death Neg Hx   . Hearing loss Neg Hx   . Heart disease Neg Hx   . Hyperlipidemia Neg Hx   . Hypertension Neg Hx   . Kidney disease Neg Hx   . Learning disabilities Neg Hx   . Mental illness Neg Hx   . Mental retardation Neg Hx   . Miscarriages / Stillbirths Neg Hx   . Stroke Neg Hx   . Vision loss Neg Hx   . Varicose Veins Neg Hx    Social History  Substance Use Topics  . Smoking status: Never Smoker   . Smokeless tobacco: Never Used  . Alcohol Use: No   OB History    Gravida Para Term Preterm AB TAB SAB Ectopic Multiple Living   2 2 2  0 0 0 0 0 0 2     Review  of Systems  Constitutional: Negative for fever and chills.  HENT: Positive for ear pain.   Gastrointestinal: Negative for nausea and vomiting.  All other systems reviewed and are negative.     Allergies  Review of patient's allergies indicates no known allergies.  Home Medications   Prior to Admission medications   Medication Sig Start Date End Date Taking? Authorizing Provider  acetaminophen (TYLENOL) 500 MG tablet Take 500 mg by mouth every 6 (six) hours as needed for moderate pain.    Historical Provider, MD  ferrous sulfate 325 (65 FE) MG tablet Take 1 tablet (325 mg total) by mouth 2 (two) times daily with a meal. Patient not taking: Reported on 12/30/2014 09/05/14   Venus Standard, CNM  ibuprofen (ADVIL,MOTRIN) 600 MG tablet Take 1 tablet every 6 hours for the next 2 days, then take if needed every 6 hours after that. Patient taking differently: Take 600 mg by mouth every 6 (six) hours as needed for moderate pain.  10/17/14   Nigel BridgemanVicki Latham, CNM  ondansetron (ZOFRAN) 4 MG tablet Take 1  tablet (4 mg total) by mouth every 8 (eight) hours as needed for nausea or vomiting. 08/08/15   Myra Rude, MD  Prenatal Vit-Fe Fumarate-FA (PRENATAL MULTIVITAMIN) TABS tablet Take 1 tablet by mouth daily at 12 noon.    Historical Provider, MD  promethazine (PHENERGAN) 25 MG tablet Take 1 tablet (25 mg total) by mouth every 6 (six) hours as needed for nausea or vomiting. 12/30/14   Vanetta Mulders, MD   BP 110/70 mmHg  Pulse 84  Temp(Src) 97.6 F (36.4 C) (Oral)  Resp 16  SpO2 95%  LMP 02/17/2016 Physical Exam  Constitutional: She is oriented to person, place, and time. She appears well-developed and well-nourished.  HENT:  Head: Normocephalic and atraumatic.  Left ear: TM erythematous with effusion  Right ear normal No mastoid tenderness, erythema or edema  Eyes: Conjunctivae and EOM are normal. Pupils are equal, round, and reactive to light.  No nystagmus  Cardiovascular: Normal rate.    Pulmonary/Chest: Effort normal.  Neurological: She is alert and oriented to person, place, and time.  Skin: Skin is warm and dry.  Psychiatric: She has a normal mood and affect.  Nursing note and vitals reviewed.   ED Course  Procedures (including critical care time). DIAGNOSTIC STUDIES: Oxygen Saturation is 95% on RA, normal by my interpretation.    COORDINATION OF CARE: 12:26 PM- Pt advised of plan for treatment and pt agrees. Pt will receive Amoxicillin for her ear infection upon examination.   MDM   Final diagnoses:  Acute left otitis media, recurrence not specified, unspecified otitis media type    Pt with AOM of left ear. Will treat with amoxicillin. Pt has ibuprofen at home which she will take for pain. Instructed ENT follow up if symptoms persist. ER return precautions given.   I personally performed the services described in this documentation, which was scribed in my presence. The recorded information has been reviewed and is accurate.   Carlene Coria, PA-C 02/20/16 1250  Loren Racer, MD 02/20/16 [phone]

## 2017-02-22 ENCOUNTER — Encounter (HOSPITAL_COMMUNITY): Payer: Self-pay | Admitting: Emergency Medicine

## 2017-02-22 ENCOUNTER — Ambulatory Visit (HOSPITAL_COMMUNITY)
Admission: EM | Admit: 2017-02-22 | Discharge: 2017-02-22 | Disposition: A | Discharge status: Home / Self Care | Payer: BLUE CROSS/BLUE SHIELD | Attending: Internal Medicine | Admitting: Internal Medicine

## 2017-02-22 DIAGNOSIS — J019 Acute sinusitis, unspecified: Secondary | ICD-10-CM | POA: Diagnosis not present

## 2017-02-22 DIAGNOSIS — J209 Acute bronchitis, unspecified: Secondary | ICD-10-CM

## 2017-02-22 MED ORDER — PREDNISONE 50 MG PO TABS: 50.0000 mg | ORAL_TABLET | Freq: Every day | ORAL | 0 refills | Status: DC

## 2017-02-22 MED ORDER — AMOXICILLIN-POT CLAVULANATE 875-125 MG PO TABS: 1.0000 | ORAL_TABLET | Freq: Two times a day (BID) | ORAL | 0 refills | Status: DC

## 2017-02-22 NOTE — Discharge Instructions (Addendum)
Anticipate gradual improvement in cough over the next several days; may take a couple weeks to completely resolve.  Prescriptions for amoxicillin/clavulanate (antibiotic) and prednisone (steroid) were sent to the [facility] on [address].  Recheck for new fever >100.5, increasing phlegm production/nasal discharge, or if not starting to improve in a few days.

## 2017-02-22 NOTE — ED Provider Notes (Signed)
[facility]    CSN: [id number] Arrival date & time: 02/22/17  1830     History   Chief Complaint Chief Complaint  Patient presents with  . Cough    HPI Kelli Griffin is a 31 y.o. female. Presents with two-week history of cough, hard coughing, sometimes with posttussive emesis. Also has some runny/congested nose. No fever.  HPI  Past Medical History:  Diagnosis Date  . Language barrier 05/12/2012  . No pertinent past medical history   . Pregnant     Patient Active Problem List   Diagnosis Date Noted  . Mastitis 10/17/2014  . Anemia--Hgb 8.1 on 09/06/14, pp 10/17/2014  . IUGR (intrauterine growth restriction) 09/01/2014  . Vaginal delivery 07/26/2012  . Midline episiotomy 07/26/2012  . Language barrier 05/12/2012    Past Surgical History:  Procedure Laterality Date  . NO PAST SURGERIES      OB History    Gravida Para Term Preterm AB Living   2 2 2  0 0 2   SAB TAB Ectopic Multiple Live Births   0 0 0 0 2       Home Medications    Prior to Admission medications   Medication Sig Start Date End Date Taking? Authorizing Provider  acetaminophen (TYLENOL) 500 MG tablet Take 500 mg by mouth every 6 (six) hours as needed for moderate pain.    [provider]  amoxicillin-clavulanate (AUGMENTIN) 875-125 MG tablet Take 1 tablet by mouth every 12 (twelve) hours. 02/22/17   Eustace MooreMurray, Andreal Vultaggio W, MD  ibuprofen (ADVIL,MOTRIN) 600 MG tablet Take 1 tablet every 6 hours for the next 2 days, then take if needed every 6 hours after that. Patient taking differently: Take 600 mg by mouth every 6 (six) hours as needed for moderate pain.  10/17/14   Nigel BridgemanLatham, Vicki, CNM  predniSONE (DELTASONE) 50 MG tablet Take 1 tablet (50 mg total) by mouth daily. 02/22/17   Eustace MooreMurray, Josephus Harriger W, MD  Prenatal Vit-Fe Fumarate-FA (PRENATAL MULTIVITAMIN) TABS tablet Take 1 tablet by mouth daily at 12 noon.    [provider]    Family History Family History  Problem Relation Age of  Onset  . Other Neg Hx   . Arthritis Neg Hx   . Alcohol abuse Neg Hx   . Asthma Neg Hx   . Birth defects Neg Hx   . Cancer Neg Hx   . COPD Neg Hx   . Depression Neg Hx   . Diabetes Neg Hx   . Drug abuse Neg Hx   . Early death Neg Hx   . Hearing loss Neg Hx   . Heart disease Neg Hx   . Hyperlipidemia Neg Hx   . Hypertension Neg Hx   . Kidney disease Neg Hx   . Learning disabilities Neg Hx   . Mental illness Neg Hx   . Mental retardation Neg Hx   . Miscarriages / Stillbirths Neg Hx   . Stroke Neg Hx   . Vision loss Neg Hx   . Varicose Veins Neg Hx     Social History Social History  Substance Use Topics  . Smoking status: Never Smoker  . Smokeless tobacco: Never Used  . Alcohol use No     Allergies   Patient has no known allergies.   Review of Systems Review of Systems  All other systems reviewed and are negative.    Physical Exam Triage Vital Signs ED Triage Vitals  Enc Vitals Group     BP  02/22/17 1909 102/69     Pulse Rate 02/22/17 1909 73     Resp 02/22/17 1909 18     Temp 02/22/17 1909 98.8 F (37.1 C)     Temp Source 02/22/17 1909 Oral     SpO2 02/22/17 1909 98 %     Weight --      Height --      Pain Score 02/22/17 1907 9     Pain Loc --    Updated Vital Signs BP 102/69 (BP Location: Right Arm)   Pulse 73   Temp 98.8 F (37.1 C) (Oral)   Resp 18   LMP 02/19/2017   SpO2 98%   Physical Exam  Constitutional: She is oriented to person, place, and time. No distress.  HENT:  Head: Atraumatic.  Bilateral TMs are markedly dull, red tinged Marked nasal congestion bilaterally Throat is injected with postnasal drainage evident  Eyes:  Conjugate gaze observed, no eye redness/discharge  Neck: Neck supple.  Cardiovascular: Normal rate and regular rhythm.   Pulmonary/Chest: No respiratory distress. She has no wheezes. She has no rales.  Coarse but symmetric breath sounds throughout  Abdominal: She exhibits no distension.  Musculoskeletal: Normal  range of motion.  Neurological: She is alert and oriented to person, place, and time.  Skin: Skin is warm and dry.  Nursing note and vitals reviewed.    UC Treatments / Results   Procedures Procedures (including critical care time) None today  Final Clinical Impressions(s) / UC Diagnoses   Final diagnoses:  Acute sinusitis with symptoms > 10 days  Acute bronchitis with bronchospasm   Anticipate gradual improvement in cough over the next several days; may take a couple weeks to completely resolve.  Prescriptions for amoxicillin/clavulanate (antibiotic) and prednisone (steroid) were sent to the [facility] on [address].  Recheck for new fever >100.5, increasing phlegm production/nasal discharge, or if not starting to improve in a few days.     New Prescriptions New Prescriptions   AMOXICILLIN-CLAVULANATE (AUGMENTIN) 875-125 MG TABLET    Take 1 tablet by mouth every 12 (twelve) hours.   PREDNISONE (DELTASONE) 50 MG TABLET    Take 1 tablet (50 mg total) by mouth daily.     Eustace Moore, MD 02/25/17 2130

## 2017-02-22 NOTE — ED Triage Notes (Signed)
Cough for 2 weeks, runny nose, cough is so severe that she vomits.

## 2017-03-16 ENCOUNTER — Emergency Department (HOSPITAL_COMMUNITY)
Admission: EM | Admit: 2017-03-16 | Discharge: 2017-03-16 | Disposition: A | Discharge status: Home / Self Care | Payer: BLUE CROSS/BLUE SHIELD | Attending: Emergency Medicine | Admitting: Emergency Medicine

## 2017-03-16 ENCOUNTER — Emergency Department (HOSPITAL_COMMUNITY): Payer: BLUE CROSS/BLUE SHIELD

## 2017-03-16 ENCOUNTER — Encounter (HOSPITAL_COMMUNITY): Payer: Self-pay | Admitting: Emergency Medicine

## 2017-03-16 DIAGNOSIS — R05 Cough: Secondary | ICD-10-CM

## 2017-03-16 DIAGNOSIS — R059 Cough, unspecified: Secondary | ICD-10-CM

## 2017-03-16 DIAGNOSIS — Z79899 Other long term (current) drug therapy: Secondary | ICD-10-CM | POA: Insufficient documentation

## 2017-03-16 MED ORDER — FAMOTIDINE 20 MG PO TABS: 20.0000 mg | ORAL_TABLET | Freq: Two times a day (BID) | ORAL | 0 refills | Status: DC

## 2017-03-16 NOTE — Discharge Instructions (Signed)
Take Pepcid twice a day for the next 2 weeks Return if worsening

## 2017-03-16 NOTE — ED Provider Notes (Signed)
[facility] Note   CSN: [id number] Arrival date & time: 03/16/17  1340  By signing my name below, I, Thelma Barge, attest that this documentation has been prepared under the direction and in the presence of Terance Hart, PA-C. Electronically Signed: Thelma Barge, Scribe. 03/16/17. 3:30 PM.  History   Chief Complaint Chief Complaint  Patient presents with  . Cough  The history is provided by the patient. A language interpreter was used.    HPI Comments: Kelli Griffin is a 31 y.o. female who presents to the Emergency Department complaining of constant, persistent cough for 7 weeks. She has associated subjective fever and posttussive vomiting. She states the symptoms are worse at night when she lies down. She went a month ago to urgent care where she was prescribed steroids and Augmentin but these did not help her symptoms so she came to the ED. She states her symptoms are not worse after eating. She denies SOB, abdominal pain, sore throat, ear pain, and runny nose. She also denies being around anybody sick, environmental allergies, any sour tastes in her mouth, and smoking. Pt has NKDA.  Past Medical History:  Diagnosis Date  . Language barrier 05/12/2012  . No pertinent past medical history   . Pregnant     Patient Active Problem List   Diagnosis Date Noted  . Mastitis 10/17/2014  . Anemia--Hgb 8.1 on 09/06/14, pp 10/17/2014  . IUGR (intrauterine growth restriction) 09/01/2014  . Vaginal delivery 07/26/2012  . Midline episiotomy 07/26/2012  . Language barrier 05/12/2012    Past Surgical History:  Procedure Laterality Date  . NO PAST SURGERIES      OB History    Gravida Para Term Preterm AB Living   2 2 2  0 0 2   SAB TAB Ectopic Multiple Live Births   0 0 0 0 2       Home Medications    Prior to Admission medications   Medication Sig Start Date End Date Taking? Authorizing Provider  acetaminophen (TYLENOL) 500 MG tablet Take 500 mg by mouth every 6  (six) hours as needed for moderate pain.    [provider]  amoxicillin-clavulanate (AUGMENTIN) 875-125 MG tablet Take 1 tablet by mouth every 12 (twelve) hours. 02/22/17   Eustace Moore, MD  ibuprofen (ADVIL,MOTRIN) 600 MG tablet Take 1 tablet every 6 hours for the next 2 days, then take if needed every 6 hours after that. Patient taking differently: Take 600 mg by mouth every 6 (six) hours as needed for moderate pain.  10/17/14   Nigel Bridgeman, CNM  predniSONE (DELTASONE) 50 MG tablet Take 1 tablet (50 mg total) by mouth daily. 02/22/17   Eustace Moore, MD  Prenatal Vit-Fe Fumarate-FA (PRENATAL MULTIVITAMIN) TABS tablet Take 1 tablet by mouth daily at 12 noon.    [provider]    Family History Family History  Problem Relation Age of Onset  . Other Neg Hx   . Arthritis Neg Hx   . Alcohol abuse Neg Hx   . Asthma Neg Hx   . Birth defects Neg Hx   . Cancer Neg Hx   . COPD Neg Hx   . Depression Neg Hx   . Diabetes Neg Hx   . Drug abuse Neg Hx   . Early death Neg Hx   . Hearing loss Neg Hx   . Heart disease Neg Hx   . Hyperlipidemia Neg Hx   . Hypertension Neg Hx   . Kidney disease  Neg Hx   . Learning disabilities Neg Hx   . Mental illness Neg Hx   . Mental retardation Neg Hx   . Miscarriages / Stillbirths Neg Hx   . Stroke Neg Hx   . Vision loss Neg Hx   . Varicose Veins Neg Hx     Social History Social History  Substance Use Topics  . Smoking status: Never Smoker  . Smokeless tobacco: Never Used  . Alcohol use No     Allergies   Patient has no known allergies.   Review of Systems Review of Systems  Constitutional: Positive for fever. Unexpected weight change: subjective.  HENT: Negative for ear pain and sore throat.   Respiratory: Positive for cough. Negative for shortness of breath.   Gastrointestinal: Positive for vomiting. Negative for abdominal pain.  Allergic/Immunologic: Negative for environmental allergies.  All other systems reviewed  and are negative.    Physical Exam Updated Vital Signs BP 120/80   Pulse 74   Temp 98.5 F (36.9 C) (Oral)   Resp 18   LMP 02/11/2017   SpO2 100%   Physical Exam  Constitutional: She is oriented to person, place, and time. She appears well-developed and well-nourished. No distress.  HENT:  Head: Normocephalic and atraumatic.  Right Ear: Hearing, tympanic membrane, external ear and ear canal normal.  Left Ear: Hearing, tympanic membrane, external ear and ear canal normal.  Nose: Nose normal.  Mouth/Throat: Uvula is midline, oropharynx is clear and moist and mucous membranes are normal.  Eyes: Conjunctivae are normal. Pupils are equal, round, and reactive to light. Right eye exhibits no discharge. Left eye exhibits no discharge. No scleral icterus.  Neck: Normal range of motion.  Cardiovascular: Normal rate and regular rhythm.  Exam reveals no gallop and no friction rub.   No murmur heard. Pulmonary/Chest: Effort normal and breath sounds normal. No respiratory distress. She has no wheezes. She has no rales. She exhibits no tenderness.  Abdominal: Soft. Bowel sounds are normal. She exhibits no distension and no mass. There is no tenderness. There is no rebound and no guarding. No hernia.  Neurological: She is alert and oriented to person, place, and time.  Skin: Skin is warm and dry.  Psychiatric: She has a normal mood and affect. Her behavior is normal.  Nursing note and vitals reviewed.    ED Treatments / Results  DIAGNOSTIC STUDIES: Oxygen Saturation is 100% on RA, normal by my interpretation.    COORDINATION OF CARE: 3:27 PM Discussed treatment plan with pt at bedside and pt agreed to plan.  Labs (all labs ordered are listed, but only abnormal results are displayed) Labs Reviewed - No data to display  EKG  EKG Interpretation None       Radiology No results found.  Procedures Procedures (including critical care time)  Medications Ordered in ED Medications -  No data to display   Initial Impression / Assessment and Plan / ED Course  I have reviewed the triage vital signs and the nursing notes.  Pertinent labs & imaging results that were available during my care of the patient were reviewed by me and considered in my medical decision making (see chart for details).  31 year old female with chronic cough. Vitals are normal. She is well-appearing. CXR is negative. She has already been treated with Augmentin and steroids. Will try Pepcid BID to treat for atypical presentation of reflux. Advised to try this for 2 weeks and return if it is still persistent. She verbalized  understanding.  Final Clinical Impressions(s) / ED Diagnoses   Final diagnoses:  Cough    New Prescriptions New Prescriptions   No medications on file   I personally performed the services described in this documentation, which was scribed in my presence. The recorded information has been reviewed and is accurate.     Bethel BornGekas, Jillian Pianka Marie, PA-C 03/18/17 1228    Raeford RazorKohut, Stephen, MD 03/23/17 [phone]

## 2017-03-16 NOTE — ED Triage Notes (Signed)
Pt from home with worsening cough over the last 7 weeks.  [facility] but they did not help.  NAD, A&O.

## 2017-03-16 NOTE — ED Notes (Signed)
Patient transported to X-ray 

## 2017-03-16 NOTE — ED Notes (Signed)
Declined W/C at D/C and was escorted to lobby by RN. 

## 2018-06-26 ENCOUNTER — Other Ambulatory Visit: Payer: Self-pay

## 2018-06-26 ENCOUNTER — Inpatient Hospital Stay (HOSPITAL_COMMUNITY)
Admission: AD | Admit: 2018-06-26 | Discharge: 2018-06-26 | Disposition: A | Discharge status: Home / Self Care | Payer: Medicaid Other | Source: Ambulatory Visit | Attending: Obstetrics and Gynecology | Admitting: Obstetrics and Gynecology

## 2018-06-26 ENCOUNTER — Encounter (HOSPITAL_COMMUNITY): Payer: Self-pay

## 2018-06-26 DIAGNOSIS — Z791 Long term (current) use of non-steroidal anti-inflammatories (NSAID): Secondary | ICD-10-CM | POA: Diagnosis not present

## 2018-06-26 DIAGNOSIS — Z3A01 Less than 8 weeks gestation of pregnancy: Secondary | ICD-10-CM | POA: Diagnosis not present

## 2018-06-26 DIAGNOSIS — Z3201 Encounter for pregnancy test, result positive: Secondary | ICD-10-CM | POA: Diagnosis not present

## 2018-06-26 DIAGNOSIS — Z79899 Other long term (current) drug therapy: Secondary | ICD-10-CM | POA: Insufficient documentation

## 2018-06-26 DIAGNOSIS — O219 Vomiting of pregnancy, unspecified: Secondary | ICD-10-CM | POA: Diagnosis not present

## 2018-06-26 LAB — URINALYSIS, ROUTINE W REFLEX MICROSCOPIC
Bilirubin Urine: NEGATIVE
Glucose, UA: NEGATIVE mg/dL
Hgb urine dipstick: NEGATIVE
Ketones, ur: NEGATIVE mg/dL
Leukocytes, UA: NEGATIVE
Nitrite: NEGATIVE
Protein, ur: NEGATIVE mg/dL
Specific Gravity, Urine: 1.017 (ref 1.005–1.030)
pH: 6 (ref 5.0–8.0)

## 2018-06-26 LAB — POCT PREGNANCY, URINE: PREG TEST UR: POSITIVE — AB

## 2018-06-26 MED ORDER — PREPLUS 27-1 MG PO TABS: 1.0000 | ORAL_TABLET | Freq: Every day | ORAL | 12 refills | Status: AC

## 2018-06-26 MED ORDER — PROMETHAZINE HCL 25 MG PO TABS: 25.0000 mg | ORAL_TABLET | Freq: Four times a day (QID) | ORAL | 1 refills | Status: DC | PRN

## 2018-06-26 NOTE — Discharge Instructions (Signed)

## 2018-06-26 NOTE — MAU Note (Signed)
Pt states n/v x 2 weeks.  Has been able to keep fluids down, but not solids; states she had fever at night, but has not taken anything.  Denies vag bleeding or abd pain

## 2018-06-26 NOTE — MAU Provider Note (Signed)
History     CSN: [id number]  Arrival date and time: 06/26/18 1825   First Provider Initiated Contact with Patient 06/26/18 1900     Chief Complaint  Patient presents with  . Emesis   Kelli Griffin is a 32 y.o. G3P2 at [redacted]w[redacted]d by LMP who presents to MAU complaining of nausea and vomiting. She reports being nauseated and vomiting over the past 2 weeks. She reports taking a HPT last week that was negative. She reports vomiting 3 times over the past 24 hours. Reports keeping fluids down but vomits after eating solid foods and not able to keep that down. She has not taken any medication for N/V as she does not have any at home. She denies abdominal pain, vaginal bleeding, vaginal discharge, urinary symptoms or HA.    OB History    Gravida  3   Para  2   Term  2   Preterm  0   AB  0   Living  2     SAB  0   TAB  0   Ectopic  0   Multiple  0   Live Births  2           Past Medical History:  Diagnosis Date  . Language barrier 05/12/2012  . No pertinent past medical history   . Pregnant     Past Surgical History:  Procedure Laterality Date  . NO PAST SURGERIES      Family History  Problem Relation Age of Onset  . Other Neg Hx   . Arthritis Neg Hx   . Alcohol abuse Neg Hx   . Asthma Neg Hx   . Birth defects Neg Hx   . Cancer Neg Hx   . COPD Neg Hx   . Depression Neg Hx   . Diabetes Neg Hx   . Drug abuse Neg Hx   . Early death Neg Hx   . Hearing loss Neg Hx   . Heart disease Neg Hx   . Hyperlipidemia Neg Hx   . Hypertension Neg Hx   . Kidney disease Neg Hx   . Learning disabilities Neg Hx   . Mental illness Neg Hx   . Mental retardation Neg Hx   . Miscarriages / Stillbirths Neg Hx   . Stroke Neg Hx   . Vision loss Neg Hx   . Varicose Veins Neg Hx     Social History   Tobacco Use  . Smoking status: Never Smoker  . Smokeless tobacco: Never Used  Substance Use Topics  . Alcohol use: No  . Drug use: No    Allergies: No Known  Allergies  Medications Prior to Admission  Medication Sig Dispense Refill Last Dose  . acetaminophen (TYLENOL) 500 MG tablet Take 500 mg by mouth every 6 (six) hours as needed for moderate pain.   unkown  . amoxicillin-clavulanate (AUGMENTIN) 875-125 MG tablet Take 1 tablet by mouth every 12 (twelve) hours. 14 tablet 0   . famotidine (PEPCID) 20 MG tablet Take 1 tablet (20 mg total) by mouth 2 (two) times daily. 30 tablet 0   . ibuprofen (ADVIL,MOTRIN) 600 MG tablet Take 1 tablet every 6 hours for the next 2 days, then take if needed every 6 hours after that. (Patient taking differently: Take 600 mg by mouth every 6 (six) hours as needed for moderate pain. ) 30 tablet 2 Unknown at Unknown time  . predniSONE (DELTASONE) 50 MG tablet Take 1 tablet (50 mg total) by  mouth daily. 3 tablet 0   . Prenatal Vit-Fe Fumarate-FA (PRENATAL MULTIVITAMIN) TABS tablet Take 1 tablet by mouth daily at 12 noon.   Unknown at Unknown time    Review of Systems  Constitutional: Negative.   Respiratory: Negative.   Cardiovascular: Negative.   Gastrointestinal: Positive for nausea and vomiting. Negative for abdominal pain, constipation and diarrhea.  Genitourinary: Negative.   Neurological: Negative.    Physical Exam   Blood pressure 103/65, pulse 80, temperature 98.1 F (36.7 C), temperature source Oral, resp. rate 18, weight 71.4 kg, last menstrual period 05/17/2018, SpO2 100 %, unknown if currently breastfeeding.  Physical Exam  Nursing note and vitals reviewed. Constitutional: She is oriented to person, place, and time. She appears well-developed and well-nourished. No distress.  HENT:  Head: Normocephalic.  Cardiovascular: Normal rate, regular rhythm and normal heart sounds.  Respiratory: Effort normal and breath sounds normal. No respiratory distress. She has no wheezes.  GI: Soft. Bowel sounds are normal. She exhibits no distension. There is no tenderness. There is no rebound.  Neurological: She is  alert and oriented to person, place, and time.  Psychiatric: She has a normal mood and affect. Her behavior is normal. Thought content normal.    MAU Course  Procedures  MDM UPT- positive  UA - negative for ketones or protein  Results for orders placed or performed during the hospital encounter of 06/26/18 (from the past 48 hour(s))  Urinalysis, Routine w reflex microscopic     Status: None   Collection Time: 06/26/18  6:48 PM  Result Value Ref Range   Color, Urine YELLOW YELLOW   APPearance CLEAR CLEAR   Specific Gravity, Urine 1.017 1.005 - 1.030   pH 6.0 5.0 - 8.0   Glucose, UA NEGATIVE NEGATIVE mg/dL   Hgb urine dipstick NEGATIVE NEGATIVE   Bilirubin Urine NEGATIVE NEGATIVE   Ketones, ur NEGATIVE NEGATIVE mg/dL   Protein, ur NEGATIVE NEGATIVE mg/dL   Nitrite NEGATIVE NEGATIVE   Leukocytes, UA NEGATIVE NEGATIVE    Comment: Performed at [facility], [address] [place], [place] [address]  Pregnancy, urine POC     Status: Abnormal   Collection Time: 06/26/18  6:56 PM  Result Value Ref Range   Preg Test, Ur POSITIVE (A) NEGATIVE   Pt reports driving to hospital today, unable to give patient medication for nausea and vomiting in MAU. Discussed sending medication to pharmacy and patient taking medication at home. Pt agrees to plan of care. Educated and discussed nausea and vomiting during pregnancy and reasons to return to MAU. Pt stable at time of discharge. Rx for Phenergan and PNV sent to pharmacy of choice.   Assessment and Plan   1. Nausea and vomiting during pregnancy   2. Positive urine pregnancy test    Discharge home  Make appointment to be seen to initiate prenatal care- list of providers given  Safe medications in pregnancy- list given  Rx for Phenergan and PNV Return to MAU as needed   Allergies as of 06/26/2018   No Known Allergies     Medication List    STOP taking these medications   amoxicillin-clavulanate 875-125 MG tablet Commonly known as:   AUGMENTIN   ibuprofen 600 MG tablet Commonly known as:  ADVIL,MOTRIN   predniSONE 50 MG tablet Commonly known as:  DELTASONE     TAKE these medications   acetaminophen 500 MG tablet Commonly known as:  TYLENOL Take 500 mg by mouth every 6 (six) hours as needed for  moderate pain.   famotidine 20 MG tablet Commonly known as:  PEPCID Take 1 tablet (20 mg total) by mouth 2 (two) times daily.   PREPLUS 27-1 MG Tabs Take 1 tablet by mouth daily. What changed:    medication strength  when to take this   promethazine 25 MG tablet Commonly known as:  PHENERGAN Take 1 tablet (25 mg total) by mouth every 6 (six) hours as needed for nausea or vomiting.       Sharyon Cable CNM 06/26/2018, 7:24 PM

## 2018-07-22 ENCOUNTER — Inpatient Hospital Stay (HOSPITAL_COMMUNITY)
Admission: AD | Admit: 2018-07-22 | Discharge: 2018-07-22 | Disposition: A | Discharge status: Home / Self Care | Payer: Medicaid Other | Source: Ambulatory Visit | Attending: Obstetrics and Gynecology | Admitting: Obstetrics and Gynecology

## 2018-07-22 ENCOUNTER — Encounter (HOSPITAL_COMMUNITY): Payer: Self-pay | Admitting: *Deleted

## 2018-07-22 ENCOUNTER — Inpatient Hospital Stay (HOSPITAL_COMMUNITY): Payer: Medicaid Other

## 2018-07-22 DIAGNOSIS — Z3A09 9 weeks gestation of pregnancy: Secondary | ICD-10-CM | POA: Diagnosis not present

## 2018-07-22 DIAGNOSIS — R109 Unspecified abdominal pain: Secondary | ICD-10-CM

## 2018-07-22 DIAGNOSIS — O26891 Other specified pregnancy related conditions, first trimester: Secondary | ICD-10-CM | POA: Diagnosis not present

## 2018-07-22 DIAGNOSIS — O26899 Other specified pregnancy related conditions, unspecified trimester: Secondary | ICD-10-CM

## 2018-07-22 DIAGNOSIS — O219 Vomiting of pregnancy, unspecified: Secondary | ICD-10-CM | POA: Diagnosis not present

## 2018-07-22 DIAGNOSIS — O99011 Anemia complicating pregnancy, first trimester: Secondary | ICD-10-CM | POA: Diagnosis not present

## 2018-07-22 DIAGNOSIS — D509 Iron deficiency anemia, unspecified: Secondary | ICD-10-CM | POA: Diagnosis not present

## 2018-07-22 DIAGNOSIS — R110 Nausea: Secondary | ICD-10-CM

## 2018-07-22 DIAGNOSIS — R141 Gas pain: Secondary | ICD-10-CM

## 2018-07-22 LAB — CBC
HCT: 33.6 % — ABNORMAL LOW (ref 36.0–46.0)
Hemoglobin: 10.8 g/dL — ABNORMAL LOW (ref 12.0–15.0)
MCH: 22 pg — ABNORMAL LOW (ref 26.0–34.0)
MCHC: 32.1 g/dL (ref 30.0–36.0)
MCV: 68.3 fL — ABNORMAL LOW (ref 80.0–100.0)
Platelets: 249 K/uL (ref 150–400)
RBC: 4.92 MIL/uL (ref 3.87–5.11)
RDW: 14.8 % (ref 11.5–15.5)
WBC: 10.9 K/uL — ABNORMAL HIGH (ref 4.0–10.5)
nRBC: 0 % (ref 0.0–0.2)

## 2018-07-22 LAB — COMPREHENSIVE METABOLIC PANEL WITH GFR
ALT: 20 U/L (ref 0–44)
AST: 24 U/L (ref 15–41)
Albumin: 3.8 g/dL (ref 3.5–5.0)
Alkaline Phosphatase: 44 U/L (ref 38–126)
Anion gap: 12 (ref 5–15)
BUN: 7 mg/dL (ref 6–20)
CO2: 21 mmol/L — ABNORMAL LOW (ref 22–32)
Calcium: 9.6 mg/dL (ref 8.9–10.3)
Chloride: 102 mmol/L (ref 98–111)
Creatinine, Ser: 0.6 mg/dL (ref 0.44–1.00)
GFR calc Af Amer: >60 mL/min
GFR calc non Af Amer: >60 mL/min
Glucose, Bld: 116 mg/dL — ABNORMAL HIGH (ref 70–99)
Potassium: 3.7 mmol/L (ref 3.5–5.1)
Sodium: 135 mmol/L (ref 135–145)
Total Bilirubin: 0.2 mg/dL — ABNORMAL LOW (ref 0.3–1.2)
Total Protein: 7.8 g/dL (ref 6.5–8.1)

## 2018-07-22 LAB — URINALYSIS, ROUTINE W REFLEX MICROSCOPIC
Bacteria, UA: NONE SEEN
Bilirubin Urine: NEGATIVE
Glucose, UA: NEGATIVE mg/dL
Hgb urine dipstick: NEGATIVE
Ketones, ur: NEGATIVE mg/dL
Nitrite: NEGATIVE
Protein, ur: NEGATIVE mg/dL
Specific Gravity, Urine: 1.019 (ref 1.005–1.030)
pH: 7 (ref 5.0–8.0)

## 2018-07-22 LAB — LIPASE, BLOOD: Lipase: 28 U/L (ref 11–51)

## 2018-07-22 LAB — HCG, QUANTITATIVE, PREGNANCY: hCG, Beta Chain, Quant, S: 188542 m[IU]/mL — ABNORMAL HIGH (ref <5)

## 2018-07-22 LAB — ABO/RH: ABO/RH(D): O POS

## 2018-07-22 MED ORDER — MORPHINE SULFATE (PF) 4 MG/ML IV SOLN
4.0000 mg | Freq: Once | INTRAVENOUS | Status: AC
  Administered 2018-07-22: 4 mg via INTRAMUSCULAR
  Filled 2018-07-22: qty 1

## 2018-07-22 MED ORDER — SIMETHICONE 40 MG/0.6ML PO SUSP: 40.0000 mg | Freq: Four times a day (QID) | ORAL | 0 refills | Status: DC | PRN

## 2018-07-22 MED ORDER — PROMETHAZINE HCL 25 MG PO TABS: 25.0000 mg | ORAL_TABLET | Freq: Four times a day (QID) | ORAL | 0 refills | Status: DC | PRN

## 2018-07-22 NOTE — MAU Provider Note (Signed)
History    CSN: [id number] Arrival date and time: 07/22/18 4098 First Provider Initiated Contact with Patient 07/22/18 2044     Chief Complaint  Patient presents with  . Abdominal Pain   HPI  Visit completed with [place] interpreter by video.  Kelli Griffin is a 31yo G3P2002 at [redacted]w[redacted]d by LMP who presents with abdominal pain. She states the pain started overnight, and it is located in her upper abdomen. She denies any cramping, pelvic pain. Denies any vaginal bleeding or discharge. States the pain is a "tingling." Had a normal bowel movement yesterday. Denies pain or burning with urination. Denies back pain. States she has not yet established with prenatal provider, waiting on pregnancy Medicaid. Denies any history of surgeries. States was able to eat today - had fufu around 7pm without vomiting. Denies nausea currently. Reports having nausea and vomiting most morning but gets better once she has something to eat. Denies any sick contacts. States she has not tried anything for pain. Nothing makes sensation better. Pressure makes it worse.  OB History    Gravida  3   Para  2   Term  2   Preterm  0   AB  0   Living  2     SAB  0   TAB  0   Ectopic  0   Multiple  0   Live Births  2           Past Medical History:  Diagnosis Date  . Language barrier 05/12/2012  . No pertinent past medical history   . Pregnant     Past Surgical History:  Procedure Laterality Date  . NO PAST SURGERIES      Family History  Problem Relation Age of Onset  . Other Neg Hx   . Arthritis Neg Hx   . Alcohol abuse Neg Hx   . Asthma Neg Hx   . Birth defects Neg Hx   . Cancer Neg Hx   . COPD Neg Hx   . Depression Neg Hx   . Diabetes Neg Hx   . Drug abuse Neg Hx   . Early death Neg Hx   . Hearing loss Neg Hx   . Heart disease Neg Hx   . Hyperlipidemia Neg Hx   . Hypertension Neg Hx   . Kidney disease Neg Hx   . Learning disabilities Neg Hx   . Mental illness Neg Hx   . Mental retardation  Neg Hx   . Miscarriages / Stillbirths Neg Hx   . Stroke Neg Hx   . Vision loss Neg Hx   . Varicose Veins Neg Hx     Social History   Tobacco Use  . Smoking status: Never Smoker  . Smokeless tobacco: Never Used  Substance Use Topics  . Alcohol use: No  . Drug use: No    Allergies: No Known Allergies  Medications Prior to Admission  Medication Sig Dispense Refill Last Dose  . acetaminophen (TYLENOL) 500 MG tablet Take 500 mg by mouth every 6 (six) hours as needed for moderate pain.   07/22/2018 at Unknown time  . Prenatal Vit-Fe Fumarate-FA (PREPLUS) 27-1 MG TABS Take 1 tablet by mouth daily. 30 tablet 12 07/22/2018 at Unknown time  . promethazine (PHENERGAN) 25 MG tablet Take 1 tablet (25 mg total) by mouth every 6 (six) hours as needed for nausea or vomiting. 30 tablet 1 07/22/2018 at Unknown time  . famotidine (PEPCID) 20 MG tablet Take 1 tablet (20 mg  total) by mouth 2 (two) times daily. 30 tablet 0 Unknown at Unknown time    Review of Systems  Constitutional: Positive for activity change. Negative for appetite change, chills, fatigue and fever.  Respiratory: Negative for chest tightness.   Cardiovascular: Negative for chest pain and palpitations.  Gastrointestinal: Positive for abdominal pain and nausea. Negative for blood in stool, constipation, diarrhea and vomiting.  Genitourinary: Negative for difficulty urinating, dysuria, flank pain, frequency, pelvic pain, vaginal bleeding and vaginal discharge.  Musculoskeletal: Negative for arthralgias, back pain and myalgias.  Skin: Negative for rash.  Neurological: Negative for dizziness and light-headedness.   Physical Exam   Blood pressure 120/69, pulse 95, temperature 98.7 F (37.1 C), resp. rate 18, height 5\' 3"  (1.6 m), weight 73 kg, last menstrual period 05/17/2018, unknown if currently breastfeeding.  Physical Exam  Nursing note and vitals reviewed. Constitutional: She is oriented to person, place, and time. She  appears well-developed and well-nourished. She appears distressed (tearful during abdominal exam).  HENT:  Head: Normocephalic and atraumatic.  Eyes: Conjunctivae and EOM are normal. No scleral icterus.  Cardiovascular: Normal rate, regular rhythm, normal heart sounds and intact distal pulses.  No murmur heard. Respiratory: Effort normal and breath sounds normal. She has no wheezes.  GI: Soft. Bowel sounds are normal. She exhibits distension (tympanic). There is tenderness (LUQ, periumbilical). There is no rebound and no guarding.  Genitourinary:  Genitourinary Comments: deferred  Musculoskeletal: She exhibits no edema.  Neurological: She is alert and oriented to person, place, and time.  Skin: Skin is warm and dry. No rash noted.  Psychiatric: She has a normal mood and affect. Her behavior is normal.   MAU Course  Procedures  MDM -- tympanic and relatively tender on exam - will check CMP, CBC, lipase, T&S, bHCG and obtain TVUS to confirm IUP. DDX includes ectopic pregnancy, constipation with gas pain, pancreatitis.  -- IM morphine for pain (patient preference to IV)  -- U/S results consistent with IUP - labs mostly wnl though does have signs of microcytic anemia, has active prescription for iron -- pain improved with IM morphine   Assessment and Plan  Kelli Griffin is a 31yo G3P2002 at [redacted]w[redacted]d by LMP who presents with abdominal pain. [place] confirmed IUP at 10w2. Labs notable for microcytic anemia, on iron at home. CMP, lipase wnl. OB urine culture collected. Pain improved with IM morphine. Discharged home with refill of phenergan and simethicone for gas pain. Plans to establish care with [facility] faculty practice once pregnancy Medicaid application complete. Return precautions reviewed and all questions answered.   Tamera Stands, DO  07/22/2018, 9:31 PM

## 2018-07-22 NOTE — MAU Note (Signed)
Pt reports abd cramping on and off since 1am. Denies any vaginal bleeding or discharge.

## 2018-07-22 NOTE — Discharge Instructions (Signed)
Abdominal Bloating °When you have abdominal bloating, your abdomen may feel full, tight, or painful. It may also look bigger than normal or swollen (distended). Common causes of abdominal bloating include: °· Swallowing air. °· Constipation. °· Problems digesting food. °· Eating too much. °· Irritable bowel syndrome. This is a condition that affects the large intestine. °· Lactose intolerance. This is an inability to digest lactose, a natural sugar in dairy products. °· Celiac disease. This is a condition that affects the ability to digest gluten, a protein found in some grains. °· Gastroparesis. This is a condition that slows down the movement of food in the stomach and small intestine. It is more common in people with diabetes mellitus. °· Gastroesophageal reflux disease (GERD). This is a digestive condition that makes stomach acid flow back into the esophagus. °· Urinary retention. This means that the body is holding onto urine, and the bladder cannot be emptied all the way. ° °Follow these instructions at home: °Eating and drinking °· Avoid eating too much. °· Try not to swallow air while talking or eating. °· Avoid eating while lying down. °· Avoid these foods and drinks: °? Foods that cause gas, such as broccoli, cabbage, cauliflower, and baked beans. °? Carbonated drinks. °? Hard candy. °? Chewing gum. °Medicines °· Take over-the-counter and prescription medicines only as told by your health care provider. °· Take probiotic medicines. These medicines contain live bacteria or yeasts that can help digestion. °· Take coated peppermint oil capsules. °Activity °· Try to exercise regularly. Exercise may help to relieve bloating that is caused by gas and relieve constipation. °General instructions °· Keep all follow-up visits as told by your health care provider. This is important. °Contact a health care provider if: °· You have nausea and vomiting. °· You have diarrhea. °· You have abdominal pain. °· You have  unusual weight loss or weight gain. °· You have severe pain, and medicines do not help. °Get help right away if: °· You have severe chest pain. °· You have trouble breathing. °· You have shortness of breath. °· You have trouble urinating. °· You have darker urine than normal. °· You have blood in your stools or have dark, tarry stools. °Summary °· Abdominal bloating means that the abdomen is swollen. °· Common causes of abdominal bloating are swallowing air, constipation, and problems digesting food. °· Avoid eating too much and avoid swallowing air. °· Avoid foods that cause gas, carbonated drinks, hard candy, and chewing gum. °This information is not intended to replace advice given to you by your health care provider. Make sure you discuss any questions you have with your health care provider. °Document Released: 10/31/2016 Document Revised: 10/31/2016 Document Reviewed: 10/31/2016 °Elsevier Interactive Patient Education © 2018 Elsevier Inc. ° °

## 2018-07-24 LAB — CULTURE, OB URINE

## 2018-08-26 ENCOUNTER — Other Ambulatory Visit: Payer: Self-pay | Admitting: Family Medicine

## 2018-10-13 NOTE — L&D Delivery Note (Addendum)
Delivery Note   Patient Name: Kelli Griffin DOB: 01/11/1986 MRN: [id number]  Date of admission: 02/12/2019 Delivering : Dale DurhamMONTANA, Milianna Ericsson  Date of delivery: 02/13/19 Type of delivery: SVD  Newborn Data: Live born female  Birth Weight:  8lbs 6.2oz APGAR: 2, 9  Ref Range & Units 09:02  Ph Cord Blood (Venous) 7.240 - 7.380 7.181Low Panic    Comment: CRITICAL RESULT CALLED TO, READ BACK BY AND VERIFIED WITH:  J.HAZELWOOD, RN @ [phone] BY D.HARRIS,RRT ON 02/13/19   pCO2 Cord Blood (Venous) 42.0 - 56.0 67.6High    Bicarbonate 13.0 - 22.0 mmol/L 24.3High      Newborn Delivery   Time head delivered:  02/13/2019 08:36:00 Birth date/time:  02/13/2019 08:38:00 Delivery type:  Vaginal, Spontaneous     Kelli Griffin, 33 y.o., @ 2534w6d,  [id number], who was admitted for spontaneous labor. I was called to the room when she progressed +2 station in the second stage of labor.  She pushed for 30/min.  She delivered a viable infant, cephalic OA @ 0836am and restituted to the ROT position over an intact perineum. The face took over one contraction to deliver. Suspcion of shoulder dystocia was high.  A nuchal cord   was identified x1 and tight, unable to reduce, planned on somersaulting infant through the cord. But with maternal pushing effort and gentle traction, anterior shoulder was unable to deliver. @ 30 seconds, McRoberts maneuver was applied without success, shoulder dystocia was called, faulty was called, NICu was called to delivery, I explained to the parents what was happened and procedures as to what would take place, parents denies any questions or concerns at this time. I then attempted suprapubic pressure, without resolution, @ 837 am DO Earlene PlaterWallace entered the room, I began to attempt to deliver the posterior shoulder, @ 1min and 20 seconds I stepped back and DO wallace stepped to to help deliver the posterior shoulder. She was successful and posterior shoulder delivered with following the anterior shoulder then  body @ 0838am, Dr Karena AddisonErivn was present in the room. There then was a body cord x1 noted as well, the infant cord was clamped and cut by myself and infant was handed to NICU while initial step of NRP were perfmored (Dry, Stimulated, and warmed). Hat placed on baby for thermoregulation.  Apgar scores were 2 and 9. See NICU note for further detail of NRP. Prophylactic Pitocin was started in the third stage of labor for active management. The placenta delivered spontaneously, shultz, with a 3 vessel cord and was sent to LD was sent to LD.  Inspection revealed none. An examination of the vaginal vault and cervix was free from lacerations. The uterus was firm, bleeding stable. Umbilical artery blood gas were sent.  There were no complications during the procedure.  Mom and baby skin to skin following delivery. Left in stable condition. I then explain to the parents about shoulder dystocia a little further and possible cause, being a large in size baby. I assessed the newborns shoulders and noted decrease strength in the left arm which would have been the anterior shoulder, there was mild grip in the left hands, I informed the parents of my finding and stated that the Pediatrician will perform a PE after there NB has rested a bit skin to skin.  Parents denies any further questions or concerns.   Maternal Info: Anesthesia:Epidural Episiotomy: No Lacerations:  None Suture Repair: No Est. Blood Loss (mL):  261  Newborn Info:  Baby Sex: female  APGAR (1  MIN): 2   APGAR (5 MINS): 9   APGAR (10 MINS):     Mom to postpartum.  Baby to Couplet care / Skin to Skin.  Dr Richardson Dopp updated on delivery.    [place], [place], NP-C 02/13/19 9:54 AM   Addendum 10:41 AM Walked in the room and RN reported pt was +1 above umbilicus, firm, foley catheter still in place and draining adequate out, fundal massage relieved several clots. No laceration noted upon exam, labial swelling noted. I performed bi manual massage and  more clots were expressed, due to trailing membranes of the placenta at delivery retained placenta had high suspicion. Pt grimaced with pain during fundal massage, fentanyl given, cytotex 800 mcg given, IM methergine given, pt asymptomatic. Manual extraction performed and retained placenta removed. QBL total including delivered was . Fluid bolus hung, pt asymptomatic, pain controlled with fentanyl, resting in NAD. Pt is tachycardia but has been during pushing, 3gm Unasyn hung for prophylactics, uterus was -1 firm. STAT CBC drawn. Plans to leave foley in place PP for 24 hours due to swelling, RN may take it out if swelling decreases. Plan to start PO methergine series PP.  Dr Richardson Dopp was updated and aware of POC and pt status.   [facility] CNM, NP-C  Addendum 11:52 AM RN called when transporting pt to PP, reported HR 120s and fever of 101, Orders placed to given tylenol now, and continue abx Unasyn Q6H 3gm IV for 24 hours. Bleeding stable. PPH CBC resulted drop from 10.9-10, started pt on PO iron.   [facility] CNM, NP-C

## 2019-01-20 IMAGING — US US OB COMP LESS 14 WK
1 series · 15 of 20 positions shown · non-contrast
Comparison: CT of the abdomen and pelvis on 12/30/2014

CLINICAL DATA: Abdominal cramping off and on since 1 a.m..
Abdominal distension. Periumbilical pain. LMP 05/17/2018. By LMP
patient is 9 weeks 3 days. EDC by LMP is 02/21/2019.

EXAM:
OBSTETRIC <14 WK ULTRASOUND
TECHNIQUE: Transabdominal ultrasound was performed for evaluation of the
gestation as well as the maternal uterus and adnexal regions.

[Series 1: us ob comp less 14 wk · 20 acquisitions, 15 frames shown]
[im 1/20]
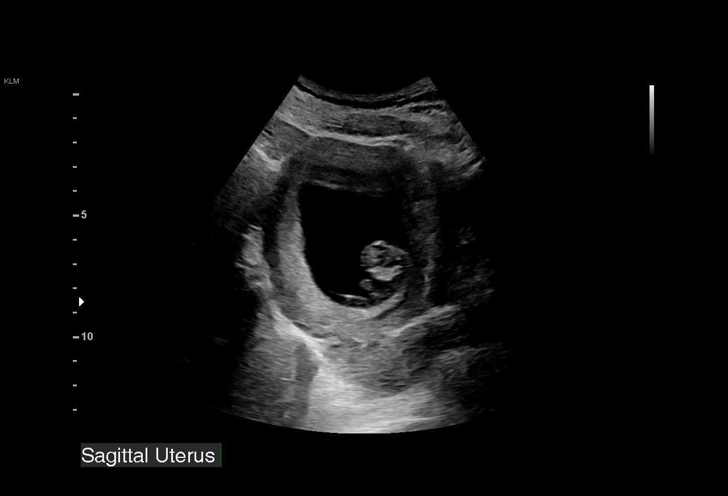
[im 3/20]
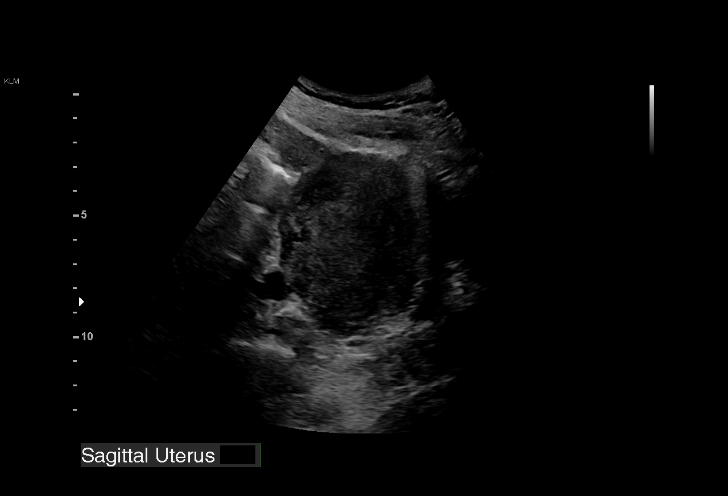
[im 4/20]
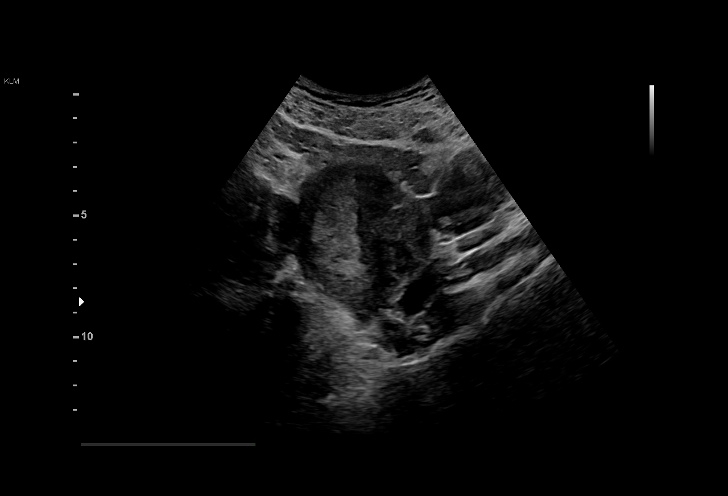
[im 5/20]
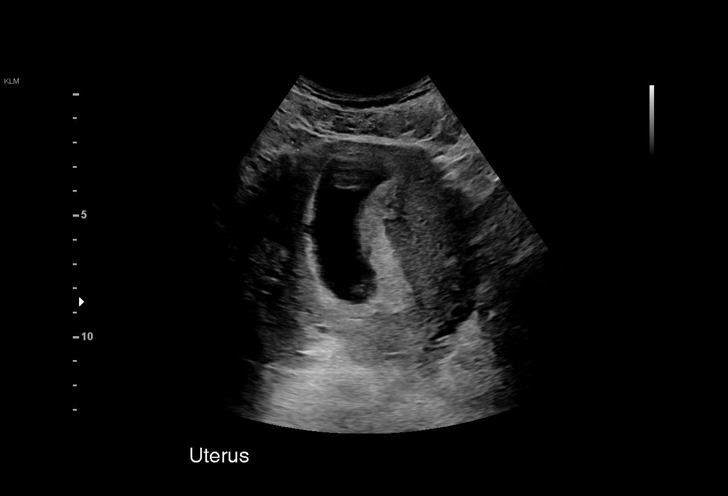
[im 7/20]
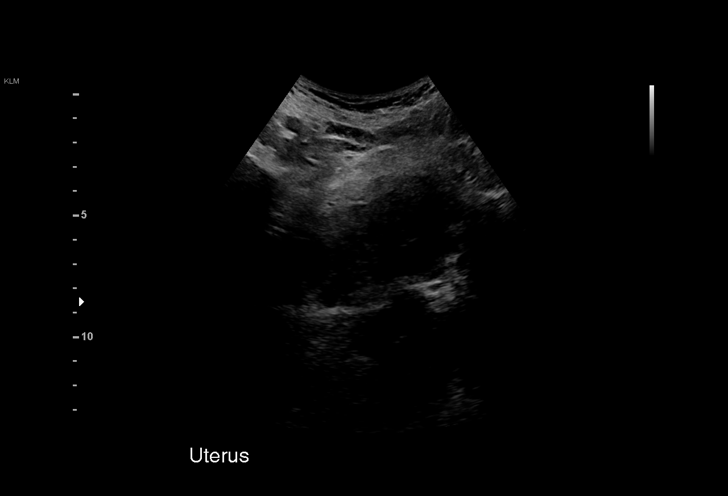
[im 8/20]
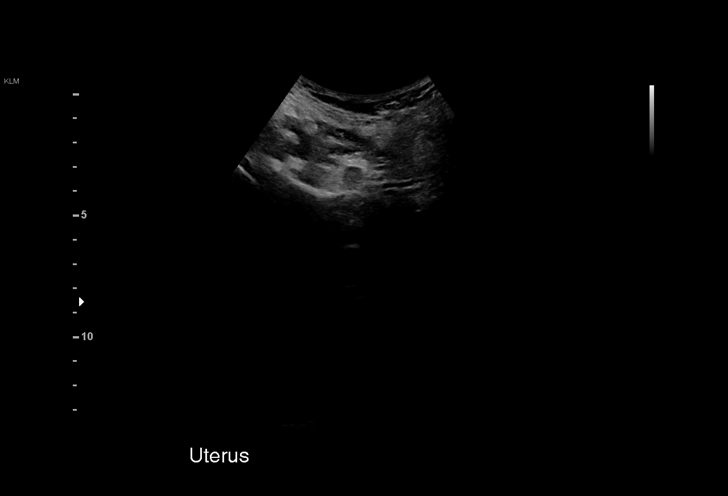
[im 9/20]
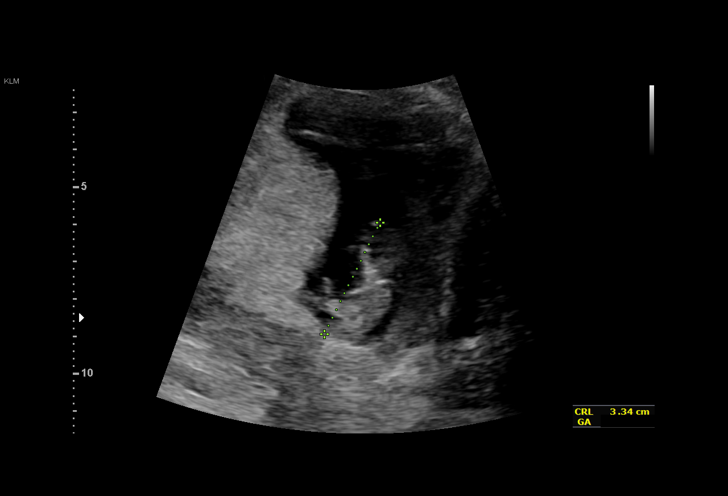
[im 11/20]
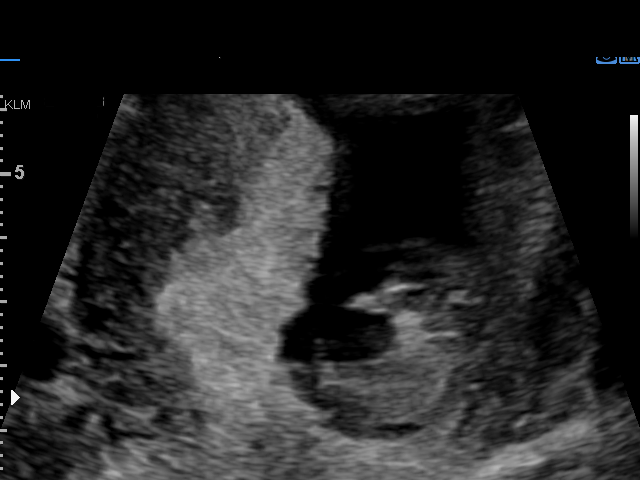
[im 12/20]
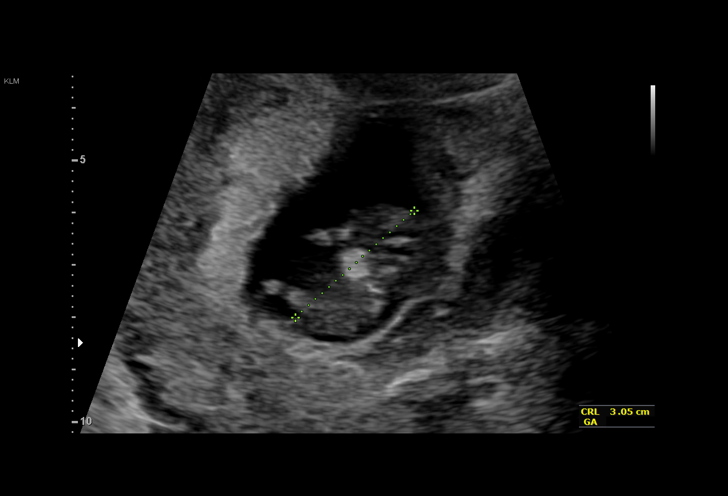
[im 13/20]
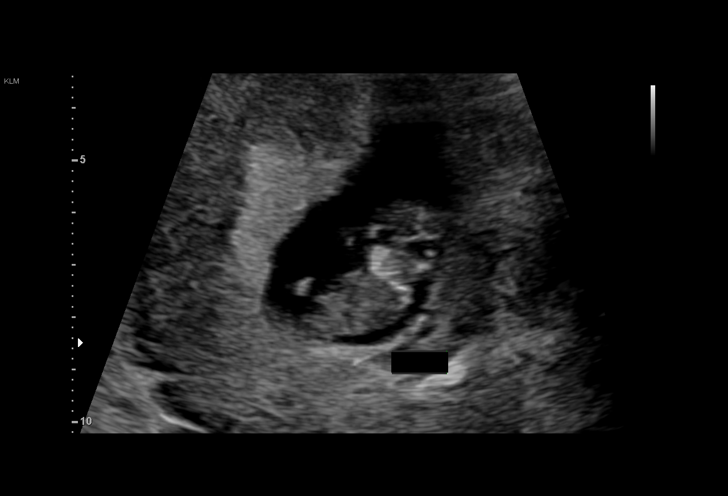
[im 15/20]
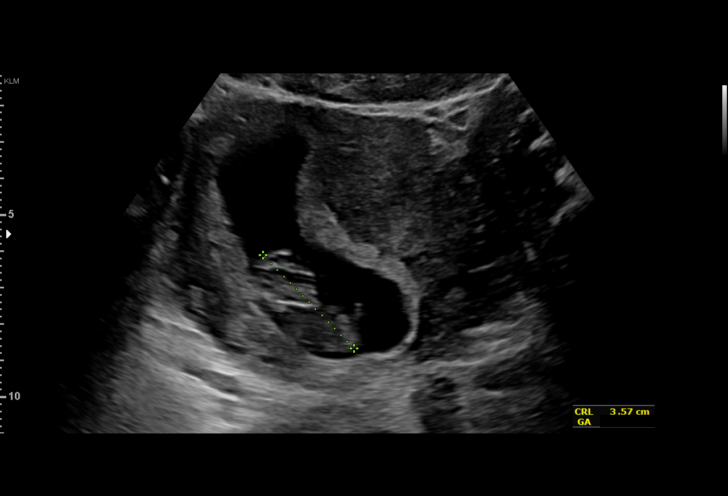
[im 16/20]
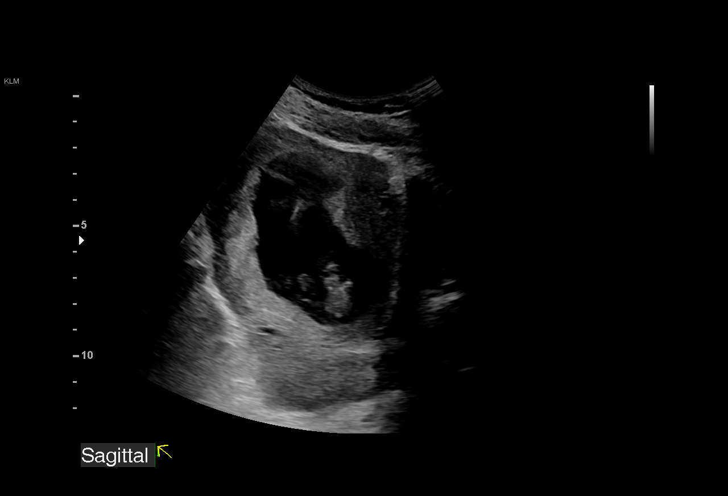
[im 17/20]
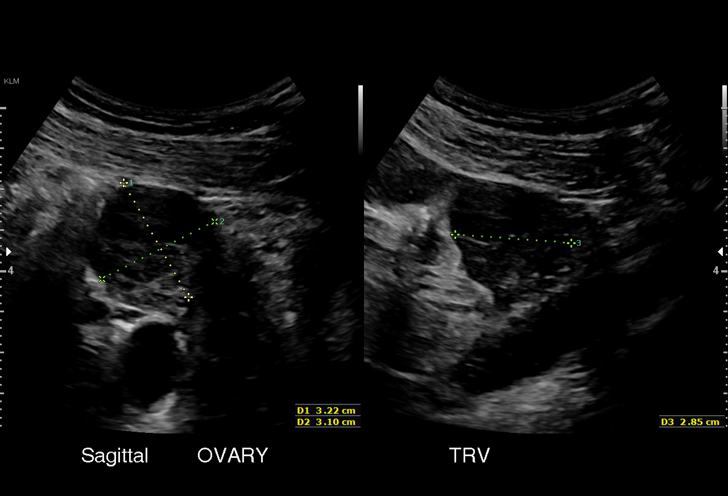
[im 19/20]
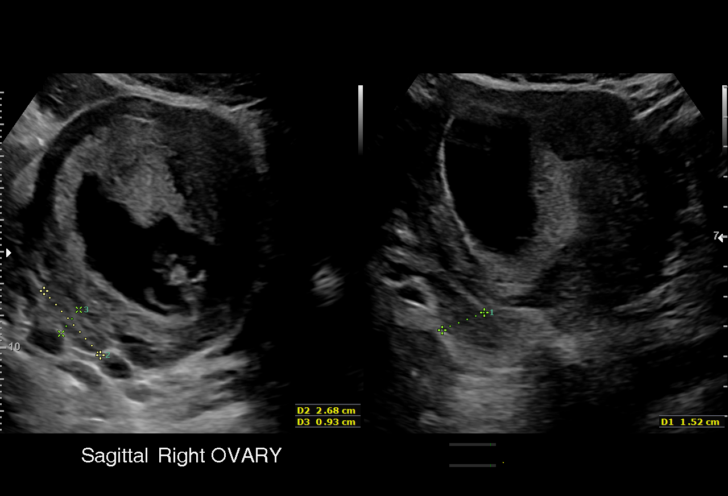
[im 20/20]
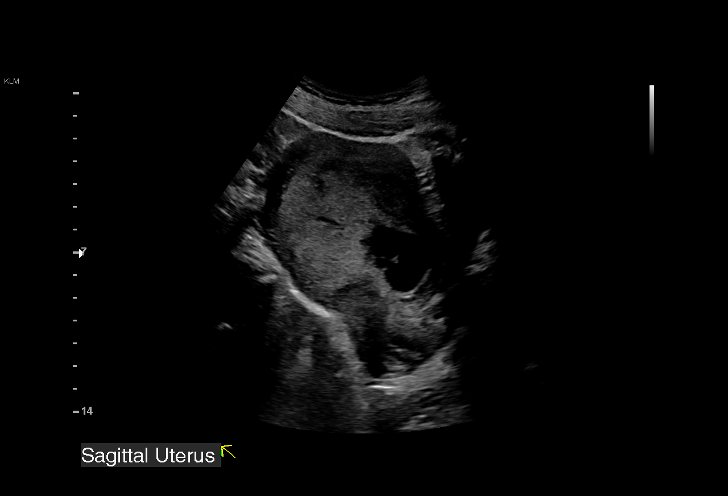

[15 of 20 positions shown; findings below may reference images not displayed]

FINDINGS: Intrauterine gestational sac: Single

Yolk sac:  Not Visualized.

Embryo:  Visualized.

Cardiac Activity: Visualized.

Heart Rate: 170 bpm

CRL:   34.2 mm   10 w 2 d                  US EDC: 02/15/2019

Subchorionic hemorrhage:  None visualized.

Maternal uterus/adnexae: Ovaries are normal in appearance. Small
corpus luteum cyst is identified in the LEFT ovary.
IMPRESSION: 1. Single living intrauterine embryo measuring 10 weeks 2 days,
differing from clinical dating.
2. By today's exam, EDC is 02/15/2019.

## 2019-02-11 ENCOUNTER — Inpatient Hospital Stay (HOSPITAL_COMMUNITY)
Admission: AD | Admit: 2019-02-11 | Discharge: 2019-02-11 | Disposition: A | Discharge status: Home / Self Care | Payer: Medicaid Other | Source: Home / Self Care | Attending: Obstetrics and Gynecology | Admitting: Obstetrics and Gynecology

## 2019-02-11 ENCOUNTER — Encounter (HOSPITAL_COMMUNITY): Payer: Self-pay

## 2019-02-11 ENCOUNTER — Other Ambulatory Visit: Payer: Self-pay

## 2019-02-11 DIAGNOSIS — O479 False labor, unspecified: Secondary | ICD-10-CM

## 2019-02-11 DIAGNOSIS — O471 False labor at or after 37 completed weeks of gestation: Secondary | ICD-10-CM

## 2019-02-11 DIAGNOSIS — Z3A38 38 weeks gestation of pregnancy: Secondary | ICD-10-CM

## 2019-02-11 NOTE — MAU Note (Signed)
Pt presents to MAU with c/o ctx that started today around 2 am. Pt denies VB and LOF. +FM

## 2019-02-12 ENCOUNTER — Other Ambulatory Visit: Payer: Self-pay

## 2019-02-12 ENCOUNTER — Inpatient Hospital Stay (HOSPITAL_COMMUNITY): Payer: Medicaid Other | Admitting: Anesthesiology

## 2019-02-12 ENCOUNTER — Inpatient Hospital Stay (HOSPITAL_COMMUNITY)
Admission: AD | Admit: 2019-02-12 | Discharge: 2019-02-15 | DRG: 806 | Disposition: A | Discharge status: Home / Self Care | Payer: Medicaid Other | Attending: Obstetrics and Gynecology | Admitting: Obstetrics and Gynecology

## 2019-02-12 ENCOUNTER — Encounter (HOSPITAL_COMMUNITY): Payer: Self-pay | Admitting: *Deleted

## 2019-02-12 DIAGNOSIS — Z3A38 38 weeks gestation of pregnancy: Secondary | ICD-10-CM

## 2019-02-12 DIAGNOSIS — O26893 Other specified pregnancy related conditions, third trimester: Secondary | ICD-10-CM | POA: Diagnosis present

## 2019-02-12 LAB — CBC
HCT: 36.4 % (ref 36.0–46.0)
Hemoglobin: 10.9 g/dL — ABNORMAL LOW (ref 12.0–15.0)
MCH: 21.3 pg — ABNORMAL LOW (ref 26.0–34.0)
MCHC: 29.9 g/dL — ABNORMAL LOW (ref 30.0–36.0)
MCV: 71.1 fL — ABNORMAL LOW (ref 80.0–100.0)
Platelets: 221 10*3/uL (ref 150–400)
RBC: 5.12 MIL/uL — ABNORMAL HIGH (ref 3.87–5.11)
RDW: 15.8 % — ABNORMAL HIGH (ref 11.5–15.5)
WBC: 8.2 10*3/uL (ref 4.0–10.5)
nRBC: 0 % (ref 0.0–0.2)

## 2019-02-12 LAB — POCT FERN TEST: POCT Fern Test: POSITIVE

## 2019-02-12 LAB — TYPE AND SCREEN
ABO/RH(D): O POS
Antibody Screen: NEGATIVE

## 2019-02-12 LAB — AMNISURE RUPTURE OF MEMBRANE (ROM) NOT AT ARMC: Amnisure ROM: POSITIVE

## 2019-02-12 MED ORDER — LIDOCAINE HCL (PF) 1 % IJ SOLN: 30.0000 mL | INTRAMUSCULAR | Status: DC | PRN

## 2019-02-12 MED ORDER — FENTANYL CITRATE (PF) 100 MCG/2ML IJ SOLN
50.0000 ug | INTRAMUSCULAR | Status: DC | PRN
  Administered 2019-02-13: 100 ug via INTRAVENOUS
  Filled 2019-02-12: qty 2

## 2019-02-12 MED ORDER — OXYTOCIN BOLUS FROM INFUSION
500.0000 mL | Freq: Once | INTRAVENOUS | Status: AC
  Administered 2019-02-13: 500 mL via INTRAVENOUS

## 2019-02-12 MED ORDER — LACTATED RINGERS IV SOLN: 500.0000 mL | Freq: Once | INTRAVENOUS | Status: DC

## 2019-02-12 MED ORDER — DIPHENHYDRAMINE HCL 50 MG/ML IJ SOLN: 12.5000 mg | INTRAMUSCULAR | Status: DC | PRN

## 2019-02-12 MED ORDER — ACETAMINOPHEN 325 MG PO TABS: 650.0000 mg | ORAL_TABLET | ORAL | Status: DC | PRN

## 2019-02-12 MED ORDER — EPHEDRINE 5 MG/ML INJ: 10.0000 mg | INTRAVENOUS | Status: DC | PRN

## 2019-02-12 MED ORDER — PHENYLEPHRINE 40 MCG/ML (10ML) SYRINGE FOR IV PUSH (FOR BLOOD PRESSURE SUPPORT): 80.0000 ug | PREFILLED_SYRINGE | INTRAVENOUS | Status: DC | PRN

## 2019-02-12 MED ORDER — OXYTOCIN 40 UNITS IN NORMAL SALINE INFUSION - SIMPLE MED
1.0000 m[IU]/min | INTRAVENOUS | Status: DC
  Administered 2019-02-13: 04:00:00 2 m[IU]/min via INTRAVENOUS
  Filled 2019-02-12: qty 1000

## 2019-02-12 MED ORDER — OXYCODONE-ACETAMINOPHEN 5-325 MG PO TABS: 1.0000 | ORAL_TABLET | ORAL | Status: DC | PRN

## 2019-02-12 MED ORDER — OXYCODONE-ACETAMINOPHEN 5-325 MG PO TABS: 2.0000 | ORAL_TABLET | ORAL | Status: DC | PRN

## 2019-02-12 MED ORDER — OXYTOCIN 40 UNITS IN NORMAL SALINE INFUSION - SIMPLE MED
2.5000 [IU]/h | INTRAVENOUS | Status: DC
  Administered 2019-02-13: 2.5 [IU]/h via INTRAVENOUS

## 2019-02-12 MED ORDER — SODIUM CHLORIDE (PF) 0.9 % IJ SOLN
INTRAMUSCULAR | Status: DC | PRN
  Administered 2019-02-12: 14 mL/h via EPIDURAL

## 2019-02-12 MED ORDER — ONDANSETRON HCL 4 MG/2ML IJ SOLN
4.0000 mg | Freq: Four times a day (QID) | INTRAMUSCULAR | Status: DC | PRN
  Administered 2019-02-13: 4 mg via INTRAVENOUS
  Filled 2019-02-12: qty 2

## 2019-02-12 MED ORDER — SOD CITRATE-CITRIC ACID 500-334 MG/5ML PO SOLN: 30.0000 mL | ORAL | Status: DC | PRN

## 2019-02-12 MED ORDER — LACTATED RINGERS IV SOLN
500.0000 mL | INTRAVENOUS | Status: DC | PRN
  Administered 2019-02-13: 500 mL via INTRAVENOUS

## 2019-02-12 MED ORDER — LACTATED RINGERS IV SOLN
INTRAVENOUS | Status: DC
  Administered 2019-02-12 (×2): via INTRAVENOUS

## 2019-02-12 MED ORDER — LIDOCAINE HCL (PF) 1 % IJ SOLN
INTRAMUSCULAR | Status: DC | PRN
  Administered 2019-02-12: 11 mL via EPIDURAL

## 2019-02-12 MED ORDER — FENTANYL-BUPIVACAINE-NACL 0.5-0.125-0.9 MG/250ML-% EP SOLN
12.0000 mL/h | EPIDURAL | Status: DC | PRN
  Filled 2019-02-12: qty 250

## 2019-02-12 NOTE — H&P (Signed)
Kelli Griffin is a 33 y.o. female, G1P0 at 38.5 weeks, presenting for labor and srom at 1100.  FM+ Denies bleeding.  Previous hx of IUGR with last pregnancy.  [place] with this pregnancy wnl.  Amnisure postive  Patient Active Problem List   Diagnosis Date Noted  . Mastitis 10/17/2014  . Anemia--Hgb 8.1 on 09/06/14, pp 10/17/2014  . IUGR (intrauterine growth restriction) 09/01/2014  . Vaginal delivery 07/26/2012  . Midline episiotomy 07/26/2012  . Language barrier 05/12/2012    History of present pregnancy: Patient entered care at 15.4 weeks.   EDC of 02/21/2019 was established by [place].   Anatomy scan:  20 weeks, with normal findings. Anatomy finished at 24 weeks. Additional [place] evaluations:  Monthly for growth.  Hx of IUGR   Significant prenatal events: none Last evaluation: This week  OB History    Gravida  3   Para  2   Term  2   Preterm  0   AB  0   Living  2     SAB  0   TAB  0   Ectopic  0   Multiple  0   Live Births  2          Past Medical History:  Diagnosis Date  . Language barrier 05/12/2012  . No pertinent past medical history   . Pregnant    Past Surgical History:  Procedure Laterality Date  . NO PAST SURGERIES     Family History: family history is not on file. Social History:  reports that she has never smoked. She has never used smokeless tobacco. She reports that she does not drink alcohol or use drugs.   Prenatal Transfer Tool  Maternal Diabetes: No Genetic Screening: Normal Maternal Ultrasounds/Referrals: Normal Fetal Ultrasounds or other Referrals:  None Maternal Substance Abuse:  No Significant Maternal Medications:  None Significant Maternal Lab Results: None  TDAP UTD Flu UTD  ROS: All 10 systems reviewed and negative except as stated above  No Known Allergies   Dilation: 3.5 Effacement (%): 70 Station: Ballotable Exam by:: AMariel Sleet, RN Blood pressure 127/83, pulse (!) 108, temperature 98.8 F (37.1 C), temperature  source Oral, resp. rate 18, weight 78.3 kg, last menstrual period 05/17/2018, SpO2 99 %, unknown if currently breastfeeding.  Chest clear Heart RRR without murmur Abd gravid, NT, FH appropriate Pelvic: adequate Ext: Neg  FHR: Category 1 FHT 140 acels, no decels, variability present UCs: every 3  Prenatal labs: ABO, Rh: --/--/O POS Performed at [facility], [address] [place], [place] [address]  (10/10 2118) Antibody:  Neg Rubella:   IMM RPR:   NR HBsAg:   Neg HIV:   NR GBS:  Neg Sickle cell/Hgb electrophoresis:  AA Pap:  2019 negative GC: Neg Chlamydia:  Neg Genetic screenings: NIPT test wnl, AFP negative Glucola:  136 Other:  3 hour negative Hgb  10.6 at 28 weeks       Assessment/Plan: IUP at 38.5 IUP in active labor with SROM clear fluid at 1100. Cat 1 strip GBS negative  Plan: Admit to Birthing Suite  Routine [facility] orders Pain med/epidural prn   Henderson Newcomer ProtheroCNM, MSN 02/12/2019, 8:51 PM

## 2019-02-12 NOTE — Anesthesia Procedure Notes (Signed)
Epidural Patient location during procedure: OB Start time: 02/12/2019 10:23 PM End time: 02/12/2019 10:29 PM  Staffing Anesthesiologist: Lowella Curb, MD Performed: anesthesiologist   Preanesthetic Checklist Completed: patient identified, site marked, surgical consent, pre-op evaluation, timeout performed, IV checked, risks and benefits discussed and monitors and equipment checked  Epidural Patient position: sitting Prep: ChloraPrep Patient monitoring: heart rate, cardiac monitor, continuous pulse ox and blood pressure Approach: midline Location: L2-L3 Injection technique: LOR saline  Needle:  Needle type: Tuohy  Needle gauge: 17 G Needle length: 9 cm Needle insertion depth: 5 cm Catheter type: closed end flexible Catheter size: 20 Guage Catheter at skin depth: 9 cm Test dose: negative  Assessment Events: blood not aspirated, injection not painful, no injection resistance, negative IV test and no paresthesia  Additional Notes Reason for block:procedure for pain

## 2019-02-12 NOTE — Anesthesia Preprocedure Evaluation (Signed)
Anesthesia Evaluation  Patient identified by MRN, date of birth, ID band Patient awake    Reviewed: Allergy & Precautions, H&P , NPO status , Patient's Chart, lab work & pertinent test results  History of Anesthesia Complications Negative for: history of anesthetic complications  Airway Mallampati: II  TM Distance: >3 FB Neck ROM: full    Dental no notable dental hx.    Pulmonary neg pulmonary ROS,    Pulmonary exam normal breath sounds clear to auscultation       Cardiovascular negative cardio ROS Normal cardiovascular exam Rhythm:Regular Rate:Normal     Neuro/Psych negative neurological ROS  negative psych ROS   GI/Hepatic negative GI ROS, Neg liver ROS,   Endo/Other  negative endocrine ROS  Renal/GU negative Renal ROS  negative genitourinary   Musculoskeletal negative musculoskeletal ROS (+)   Abdominal Normal abdominal exam  (+)   Peds negative pediatric ROS (+)  Hematology negative hematology ROS (+)   Anesthesia Other Findings   Reproductive/Obstetrics (+) Pregnancy                             Anesthesia Physical  Anesthesia Plan  ASA: II  Anesthesia Plan: Epidural   Post-op Pain Management:    Induction:   PONV Risk Score and Plan:   Airway Management Planned:   Additional Equipment:   Intra-op Plan:   Post-operative Plan:   Informed Consent: I have reviewed the patients History and Physical, chart, labs and discussed the procedure including the risks, benefits and alternatives for the proposed anesthesia with the patient or authorized representative who has indicated his/her understanding and acceptance.     Dental advisory given  Plan Discussed with: Anesthesiologist  Anesthesia Plan Comments:         Anesthesia Quick Evaluation

## 2019-02-12 NOTE — MAU Note (Signed)
Kelli Griffin is a 33 y.o. at [redacted]w[redacted]d here in MAU reporting: reports some white discharge around 0400 and had some "white like water" LOF around 1100. Was 3 cm yesterday. Reports contractions. No bleeding. + FM  Onset of complaint: today  Pain score: 7/10  Vitals:   02/12/19 1852  BP: 127/83  Pulse: (!) 108  Resp: 18  Temp: 98.8 F (37.1 C)  SpO2: 99%     FHT: + FM  Lab orders placed from triage: none

## 2019-02-13 ENCOUNTER — Encounter (HOSPITAL_COMMUNITY): Payer: Self-pay | Admitting: General Practice

## 2019-02-13 LAB — CBC
HCT: 32.9 % — ABNORMAL LOW (ref 36.0–46.0)
Hemoglobin: 10 g/dL — ABNORMAL LOW (ref 12.0–15.0)
MCH: 21.4 pg — ABNORMAL LOW (ref 26.0–34.0)
MCHC: 30.4 g/dL (ref 30.0–36.0)
MCV: 70.3 fL — ABNORMAL LOW (ref 80.0–100.0)
Platelets: 200 10*3/uL (ref 150–400)
RBC: 4.68 MIL/uL (ref 3.87–5.11)
RDW: 15.7 % — ABNORMAL HIGH (ref 11.5–15.5)
WBC: 11 10*3/uL — ABNORMAL HIGH (ref 4.0–10.5)
nRBC: 0 % (ref 0.0–0.2)

## 2019-02-13 LAB — ABO/RH: ABO/RH(D): O POS

## 2019-02-13 MED ORDER — DIPHENHYDRAMINE HCL 25 MG PO CAPS: 25.0000 mg | ORAL_CAPSULE | Freq: Four times a day (QID) | ORAL | Status: DC | PRN

## 2019-02-13 MED ORDER — TETANUS-DIPHTH-ACELL PERTUSSIS 5-2.5-18.5 LF-MCG/0.5 IM SUSP: 0.5000 mL | Freq: Once | INTRAMUSCULAR | Status: AC

## 2019-02-13 MED ORDER — ONDANSETRON HCL 4 MG/2ML IJ SOLN: 4.0000 mg | INTRAMUSCULAR | Status: DC | PRN

## 2019-02-13 MED ORDER — SENNOSIDES-DOCUSATE SODIUM 8.6-50 MG PO TABS
2.0000 | ORAL_TABLET | ORAL | Status: DC
  Administered 2019-02-13 – 2019-02-14 (×2): 2 via ORAL
  Filled 2019-02-13 (×2): qty 2

## 2019-02-13 MED ORDER — BENZOCAINE-MENTHOL 20-0.5 % EX AERO
1.0000 "application " | INHALATION_SPRAY | CUTANEOUS | Status: DC | PRN
  Filled 2019-02-13: qty 56

## 2019-02-13 MED ORDER — PRENATAL MULTIVITAMIN CH
1.0000 | ORAL_TABLET | Freq: Every day | ORAL | Status: DC
  Administered 2019-02-13 – 2019-02-15 (×3): 1 via ORAL
  Filled 2019-02-13 (×3): qty 1

## 2019-02-13 MED ORDER — SIMETHICONE 80 MG PO CHEW: 80.0000 mg | CHEWABLE_TABLET | ORAL | Status: DC | PRN

## 2019-02-13 MED ORDER — ONDANSETRON HCL 4 MG PO TABS: 4.0000 mg | ORAL_TABLET | ORAL | Status: DC | PRN

## 2019-02-13 MED ORDER — MISOPROSTOL 200 MCG PO TABS
800.0000 ug | ORAL_TABLET | Freq: Once | ORAL | Status: AC
  Administered 2019-02-13: 800 ug via RECTAL

## 2019-02-13 MED ORDER — DIBUCAINE (PERIANAL) 1 % EX OINT: 1.0000 "application " | TOPICAL_OINTMENT | CUTANEOUS | Status: DC | PRN

## 2019-02-13 MED ORDER — METHYLERGONOVINE MALEATE 0.2 MG/ML IJ SOLN
INTRAMUSCULAR | Status: AC
  Filled 2019-02-13: qty 1

## 2019-02-13 MED ORDER — WITCH HAZEL-GLYCERIN EX PADS: 1.0000 "application " | MEDICATED_PAD | CUTANEOUS | Status: DC | PRN

## 2019-02-13 MED ORDER — FERROUS SULFATE 325 (65 FE) MG PO TABS
325.0000 mg | ORAL_TABLET | Freq: Two times a day (BID) | ORAL | Status: DC
  Administered 2019-02-13 – 2019-02-15 (×4): 325 mg via ORAL
  Filled 2019-02-13 (×4): qty 1

## 2019-02-13 MED ORDER — SODIUM CHLORIDE 0.9 % IV SOLN
3.0000 g | Freq: Four times a day (QID) | INTRAVENOUS | Status: AC
  Administered 2019-02-13 – 2019-02-14 (×3): 3 g via INTRAVENOUS
  Filled 2019-02-13 (×4): qty 3

## 2019-02-13 MED ORDER — ACETAMINOPHEN 325 MG PO TABS
650.0000 mg | ORAL_TABLET | ORAL | Status: DC | PRN
  Administered 2019-02-13 – 2019-02-14 (×3): 650 mg via ORAL
  Filled 2019-02-13 (×3): qty 2

## 2019-02-13 MED ORDER — METHYLERGONOVINE MALEATE 0.2 MG PO TABS: 0.2000 mg | ORAL_TABLET | ORAL | Status: DC

## 2019-02-13 MED ORDER — ZOLPIDEM TARTRATE 5 MG PO TABS: 5.0000 mg | ORAL_TABLET | Freq: Every evening | ORAL | Status: DC | PRN

## 2019-02-13 MED ORDER — MISOPROSTOL 200 MCG PO TABS
ORAL_TABLET | ORAL | Status: AC
  Filled 2019-02-13: qty 4

## 2019-02-13 MED ORDER — SODIUM CHLORIDE 0.9 % IV SOLN
3.0000 g | Freq: Once | INTRAVENOUS | Status: AC
  Administered 2019-02-13: 3 g via INTRAVENOUS
  Filled 2019-02-13: qty 3

## 2019-02-13 MED ORDER — IBUPROFEN 600 MG PO TABS
600.0000 mg | ORAL_TABLET | Freq: Four times a day (QID) | ORAL | Status: DC
  Administered 2019-02-13 – 2019-02-15 (×8): 600 mg via ORAL
  Filled 2019-02-13 (×9): qty 1

## 2019-02-13 MED ORDER — METHYLERGONOVINE MALEATE 0.2 MG PO TABS
0.2000 mg | ORAL_TABLET | Freq: Four times a day (QID) | ORAL | Status: AC
  Administered 2019-02-13 – 2019-02-14 (×5): 0.2 mg via ORAL
  Filled 2019-02-13 (×5): qty 1

## 2019-02-13 MED ORDER — SODIUM CHLORIDE 0.9 % IV SOLN: 3.0000 g | Freq: Four times a day (QID) | INTRAVENOUS | Status: DC

## 2019-02-13 MED ORDER — COCONUT OIL OIL: 1.0000 "application " | TOPICAL_OIL | Status: DC | PRN

## 2019-02-13 MED ORDER — METHYLERGONOVINE MALEATE 0.2 MG/ML IJ SOLN
0.2000 mg | Freq: Once | INTRAMUSCULAR | Status: AC
  Administered 2019-02-13: 0.2 mg via INTRAMUSCULAR

## 2019-02-13 NOTE — Progress Notes (Signed)
Subjective: Pt comfortable with epidural.    Objective: BP (!) 130/99   Pulse (!) 109   Temp 97.9 F (36.6 C) (Oral)   Resp 16   Ht 5\' 2"  (1.575 m)   Wt 78.3 kg   LMP 05/17/2018 (Exact Date)   SpO2 100%   BMI 31.59 kg/m  No intake/output data recorded. No intake/output data recorded.  FHT: Category 1 FHT 140 accels, no decels.   UC:   regular, every 3-5 minutes SVE:   Dilation: 4 Effacement (%): 80 Station: -2 Exam by:: S Willis RN AROM of fore bag return of copious amount of fluid, light mec stained Assessment:  G3P2002 at 38.6 IUP in active labor Cat 1 strip  Plan: Consider pitocin if needed Anticipate SVD  Kenney Houseman CNM, MSN 02/13/2019, 12:19 AM

## 2019-02-13 NOTE — Progress Notes (Signed)
Interpreter # [phone] used, Patient stated she wanted husband to be her interpreter (if he is present ) during her stay in the hospital.

## 2019-02-13 NOTE — Anesthesia Postprocedure Evaluation (Signed)
Anesthesia Post Note  Patient: Kelli Griffin  Procedure(s) Performed: AN AD HOC LABOR EPIDURAL     Patient location during evaluation: Mother Baby Anesthesia Type: Epidural Level of consciousness: awake and alert, oriented and patient cooperative Pain management: pain level controlled Vital Signs Assessment: post-procedure vital signs reviewed and stable Respiratory status: spontaneous breathing Cardiovascular status: stable Postop Assessment: no headache, epidural receding, patient able to bend at knees, no signs of nausea or vomiting and able to ambulate Anesthetic complications: no Comments: Pt. Interviewed via phone call d/t COVID 19.  Pt. States she is able to ambulate.  Pain score 8 but RN at bedside with pain meds.     Last Vitals:  Vitals:   02/13/19 1300 02/13/19 1700  BP: 109/85 (P) 107/75  Pulse: (!) 120 (P) 93  Resp: 16 (P) 18  Temp: 37.3 C (P) 37.3 C  SpO2: 99%     Last Pain:  Vitals:   02/13/19 1300  TempSrc:   PainSc: 3    Pain Goal:                   [facility]

## 2019-02-13 NOTE — Progress Notes (Signed)
Delivery Attendance  Called to room because of shoulder dystocia. Informed on arrival to room that had been 1 min and 10 seconds, McRoberts and suprapubic pressure had been tried without success. Midwife, [facility], at bedside attempting maneuvers. I put on gloves and assisted with removal of the posterior arm. Tight nuchal cord x 1 reduced at perineum, and then rest of shoulders of body delivered. Cord immediately clamped and cut, and infant was handed over to newborn team. Code Apgar and delivery called. I left the room at this time. Dr. Alysia Penna was present in the room to assist if needed, and we briefed the incoming neonatologist.  Kelli Deer. Earlene Plater, DO OB/GYN Fellow

## 2019-02-13 NOTE — Lactation Note (Signed)
This note was copied from a baby's chart. Lactation Consultation Note FOB is mom's interpreter. He has stepped ou, will return in a little while. Mom asked to come back later when he is there.  Mom states BF going well. She BF her 1st child 6 months. Mom speaks some [place]. Will return.  Patient Name: Girl Andrielle Deckard [id number] Date: 02/13/2019     Maternal Data    Feeding    LATCH Score                   Interventions    Lactation Tools Discussed/Used     Consult Status      Charyl Dancer 02/13/2019, 9:25 PM

## 2019-02-13 NOTE — Progress Notes (Signed)
Upon Arrival to [facility], Patient had a temperature of 101.2, [facility] FNP notified, will give tylenol 650mg  and order place for antibiotics to be started. Bleeding stable, will continue to monitor, Patient has no sore throat, cough or sneezing.

## 2019-02-14 LAB — CBC
HCT: 29.5 % — ABNORMAL LOW (ref 36.0–46.0)
Hemoglobin: 8.9 g/dL — ABNORMAL LOW (ref 12.0–15.0)
MCH: 21.3 pg — ABNORMAL LOW (ref 26.0–34.0)
MCHC: 30.2 g/dL (ref 30.0–36.0)
MCV: 70.7 fL — ABNORMAL LOW (ref 80.0–100.0)
Platelets: 195 10*3/uL (ref 150–400)
RBC: 4.17 MIL/uL (ref 3.87–5.11)
RDW: 15.6 % — ABNORMAL HIGH (ref 11.5–15.5)
WBC: 12 10*3/uL — ABNORMAL HIGH (ref 4.0–10.5)
nRBC: 0 % (ref 0.0–0.2)

## 2019-02-14 LAB — RPR: RPR Ser Ql: NONREACTIVE

## 2019-02-14 LAB — HIV ANTIBODY (ROUTINE TESTING W REFLEX): HIV Screen 4th Generation wRfx: NONREACTIVE

## 2019-02-14 MED ORDER — FERROUS SULFATE 325 (65 FE) MG PO TABS: 325.0000 mg | ORAL_TABLET | Freq: Two times a day (BID) | ORAL | 3 refills | Status: AC

## 2019-02-14 MED ORDER — IBUPROFEN 600 MG PO TABS: 600.0000 mg | ORAL_TABLET | Freq: Four times a day (QID) | ORAL | 0 refills | Status: AC

## 2019-02-14 NOTE — Discharge Summary (Signed)
OB Discharge Summary     Patient Name: Kelli Griffin DOB: 03/20/1986 MRN: [id number]  Date of admission: 02/12/2019 Delivering MD: Dale DurhamMONTANA, JADE   Date of discharge: 02/14/2019  Admitting diagnosis: CTX Intrauterine pregnancy: 4821w6d     Secondary diagnosis:  Active Problems:   Normal labor  Additional problems: postpartum hemmorrhage     Discharge diagnosis: Term Pregnancy Delivered                                                                                                Post partum procedures:None  Augmentation: AROM and Pitocin  Complications: None  Hospital course:  Onset of Labor With Vaginal Delivery     33 y.o. yo G3P3003 at 3721w6d was admitted in Latent Labor on 02/12/2019. Patient had an uncomplicated labor course as follows:  Membrane Rupture Time/Date: 11:00 AM ,02/12/2019   Intrapartum Procedures: Episiotomy: None [1]                                         Lacerations:  None [1]  Patient had a delivery of a Viable infant. 02/13/2019  Information for the patient's newborn:  Dionne BucyBoukari, Girl Anelia [[id number]]  Delivery Method: Vaginal, Spontaneous(Filed from Delivery Summary)    Pateint had an uncomplicated postpartum course.  She is ambulating, tolerating a regular diet, passing flatus, and urinating well. Patient is discharged home in stable condition on 02/14/19.   Physical exam  Vitals:   02/13/19 1300 02/13/19 1700 02/13/19 2059 02/14/19 0143  BP: 109/85 107/75 107/74 95/65  Pulse: (!) 120 93 89 86  Resp: 16 18 19 18   Temp: 99.1 F (37.3 C) 99.2 F (37.3 C) 98.4 F (36.9 C) 97.8 F (36.6 C)  TempSrc:   Oral Oral  SpO2: 99%  96% 91%  Weight:      Height:       General: alert, cooperative and no distress Lochia: appropriate Uterine Fundus: firm Incision: N/A DVT Evaluation: No evidence of DVT seen on physical exam. Labs: Lab Results  Component Value Date   WBC 12.0 (H) 02/14/2019   HGB 8.9 (L) 02/14/2019   HCT 29.5 (L) 02/14/2019   MCV 70.7 (L)  02/14/2019   PLT 195 02/14/2019   CMP Latest Ref Rng & Units 07/22/2018  Glucose 70 - 99 mg/dL 782(N116(H)  BUN 6 - 20 mg/dL 7  Creatinine 5.620.44 - 1.301.00 mg/dL 8.650.60  Sodium 784135 - 696145 mmol/L 135  Potassium 3.5 - 5.1 mmol/L 3.7  Chloride 98 - 111 mmol/L 102  CO2 22 - 32 mmol/L 21(L)  Calcium 8.9 - 10.3 mg/dL 9.6  Total Protein 6.5 - 8.1 g/dL 7.8  Total Bilirubin 0.3 - 1.2 mg/dL 2.9(B0.2(L)  Alkaline Phos 38 - 126 U/L 44  AST 15 - 41 U/L 24  ALT 0 - 44 U/L 20    Discharge instruction: per After Visit Summary and "Baby and Me Booklet".  After visit meds:  PNV, ibuprofen and iron  Diet: routine diet  Activity: Advance as tolerated. Pelvic  rest for 6 weeks.   Outpatient follow up:6 weeks Follow up Appt:No future appointments. Follow up Visit:No follow-ups on file.  Postpartum contraception: Condoms  Newborn Data: Live born female  Birth Weight: 8 lb 6.2 oz (3805 g) APGAR: 2, 9  Newborn Delivery   Time head delivered:  02/13/2019 08:36:00 Birth date/time:  02/13/2019 08:38:00 Delivery type:  Vaginal, Spontaneous     Baby Feeding: Breast Disposition:home with mother   02/14/2019 Kenney Houseman, CNM

## 2019-02-14 NOTE — Lactation Note (Signed)
This note was copied from a baby's chart. Lactation Consultation Note Baby 17 hrs old. Mom speaks [place]. FOB interprets for mom. Mom states baby wants to eat all the time. Discussed cluster feeding. Mom having pain in her stomach. Explained normal. Mom stated BF going well. Has no questions. Discussed mature milk. Encouraged to call for assistance if needed.  Patient Name: Kelli Griffin QIONG'E Date: 02/14/2019 Reason for consult: Initial assessment   Maternal Data Has patient been taught Hand Expression?: Yes Does the patient have breastfeeding experience prior to this delivery?: Yes  Feeding Feeding Type: Breast Fed  LATCH Score Latch: Grasps breast easily, tongue down, lips flanged, rhythmical sucking.  Audible Swallowing: A few with stimulation  Type of Nipple: Everted at rest and after stimulation  Comfort (Breast/Nipple): Soft / non-tender  Hold (Positioning): No assistance needed to correctly position infant at breast.  LATCH Score: 9  Interventions Interventions: Breast feeding basics reviewed;Breast compression  Lactation Tools Discussed/Used     Consult Status Consult Status: Follow-up Date: 02/15/19 Follow-up type: In-patient    Margurette Brener, Diamond Nickel 02/14/2019, 1:41 AM

## 2019-02-14 NOTE — Progress Notes (Signed)
CSW received consult due to score 12 on Edinburgh Depression Screen.  CSW met with MOB at bedside, with the assistance of [employer], to offer support and complete assessment.  MOB was sitting on the couch holding infant when CSW entered the room. MOB appeared to be bonding with infant and was appropriate throughout assessment. CSW inquired about MOB's score on her [place]. MOB did not appear to remember taking questionnaire and reported that she currently felt "happy". MOB denied having any mental health history and denied experiencing any symptoms during pregnancy. MOB reported that she has 2 children and denied experiencing any PPD but was receptive to education. CSW provided education regarding Baby Blues vs PMADs and provided MOB with resources for mental health follow up.  CSW encouraged MOB to evaluate her mental health throughout the postpartum period with the use of the New Mom Checklist developed by Postpartum [facility] as well as the [place] Postnatal Depression Scale and notify a medical professional if symptoms arise.  MOB denied any current SI, HI or DV and reported feeling well supported by family members and people from her country.     MOB confirmed having all essential items for infant once discharged. MOB stated infant would be sleeping in a basinet once home. CSW provided review of Sudden Infant Death Syndrome (SIDS) precautions and safe sleeping habits.    MOB denied having any further questions or concerns for CSW. CSW identifies no further need for intervention and no barriers to discharge at this time.  Ollen Barges, [place]  [facility] [employer] [phone]

## 2019-02-15 NOTE — Discharge Summary (Signed)
OB Discharge Summary                           Patient Name: Kelli Griffin DOB: 11-Apr-1986 MRN: [id number]  Date of admission: 02/12/2019 Delivering MD: Dale [place]   Date of discharge: 02/14/2019  Admitting diagnosis: CTX Intrauterine pregnancy: [redacted]w[redacted]d     Secondary diagnosis:  Active Problems:   Normal labor  Additional problems: postpartum hemmorrhage                                      Discharge diagnosis: Term Pregnancy Delivered                                                                                                Post partum procedures:None  Augmentation: AROM and Pitocin  Complications: None  Hospital course:  Onset of Labor With Vaginal Delivery     33 y.o. yo G3P3003 at [redacted]w[redacted]d was admitted in Latent Labor on 02/12/2019. Patient had an uncomplicated labor course as follows:  Membrane Rupture Time/Date: 11:00 AM ,02/12/2019   Intrapartum Procedures: Episiotomy: None [1]                                         Lacerations:  None [1]  Patient had a delivery of a Viable infant. 02/13/2019  Information for the patient's newborn:  Tanjala, Sinagra [[id number]]  Delivery Method: Vaginal, Spontaneous(Filed from Delivery Summary)    Pateint had an uncomplicated postpartum course.  She is ambulating, tolerating a regular diet, passing flatus, and urinating well. Patient is discharged home in stable condition on 02/14/19.   Physical exam        Vitals:   02/13/19 1300 02/13/19 1700 02/13/19 2059 02/14/19 0143  BP: 109/85 107/75 107/74 95/65  Pulse: (!) 120 93 89 86  Resp: 16 18 19 18   Temp: 99.1 F (37.3 C) 99.2 F (37.3 C) 98.4 F (36.9 C) 97.8 F (36.6 C)  TempSrc:   Oral Oral  SpO2: 99%  96% 91%  Weight:      Height:       General: alert, cooperative and no distress Lochia: appropriate Uterine Fundus: firm Incision: N/A DVT Evaluation: No evidence of DVT seen on physical exam. Labs: RecentLabs       Lab Results  Component  Value Date   WBC 12.0 (H) 02/14/2019   HGB 8.9 (L) 02/14/2019   HCT 29.5 (L) 02/14/2019   MCV 70.7 (L) 02/14/2019   PLT 195 02/14/2019     CMP Latest Ref Rng & Units 07/22/2018  Glucose 70 - 99 mg/dL 465(K)  BUN 6 - 20 mg/dL 7  Creatinine 3.54 - 6.56 mg/dL 8.12  Sodium 751 - 700 mmol/L 135  Potassium 3.5 - 5.1 mmol/L 3.7  Chloride 98 - 111 mmol/L 102  CO2 22 - 32 mmol/L 21(L)  Calcium 8.9 - 10.3 mg/dL 9.6  Total Protein  6.5 - 8.1 g/dL 7.8  Total Bilirubin 0.3 - 1.2 mg/dL 1.6(X0.2(L)  Alkaline Phos 38 - 126 U/L 44  AST 15 - 41 U/L 24  ALT 0 - 44 U/L 20    Discharge instruction: per After Visit Summary and "Baby and Me Booklet".  After visit meds:  PNV, ibuprofen and iron  Diet: routine diet  Activity: Advance as tolerated. Pelvic rest for 6 weeks.   Outpatient follow up:6 weeks Follow up Appt:No future appointments. Follow up Visit:No follow-ups on file.  Postpartum contraception: Condoms  Newborn Data: Live born female  Birth Weight: 8 lb 6.2 oz (3805 g) APGAR: 2, 9  Newborn Delivery   Time head delivered:  02/13/2019 08:36:00 Birth date/time:  02/13/2019 08:38:00 Delivery type:  Vaginal, Spontaneous     Baby Feeding: Breast Disposition:home with mother

## 2019-02-15 NOTE — Lactation Note (Signed)
This note was copied from a baby's chart. Lactation Consultation Note  Patient Name: Kelli Griffin YDXAJ'O Date: 02/15/2019 Reason for consult: Follow-up assessment Baby is 49 hours old/0% weight loss.  FOB present to interpret.  Mom continues to both breast and formula feed.  Instructed on hand expression.  Milk flowing from breasts.  Discussed milk coming to volume and the prevention and treatment of engorgement.  Manual pump given with instructions.  Observed mom latch baby to breast using cradle hold.  Baby latched easily and well.  Mom denies questions or concerns.  Encouraged to call for assist prn.  Maternal Data Has patient been taught Hand Expression?: Yes  Feeding Feeding Type: Breast Fed  LATCH Score Latch: Grasps breast easily, tongue down, lips flanged, rhythmical sucking.  Audible Swallowing: Spontaneous and intermittent  Type of Nipple: Everted at rest and after stimulation  Comfort (Breast/Nipple): Soft / non-tender  Hold (Positioning): No assistance needed to correctly position infant at breast.  LATCH Score: 10  Interventions    Lactation Tools Discussed/Used Pump Review: Setup, frequency, and cleaning Initiated by:: LMoulden Date initiated:: 02/15/19   Consult Status Consult Status: Complete Follow-up type: Call as needed    Huston Foley 02/15/2019, 10:11 AM

## 2020-02-05 ENCOUNTER — Ambulatory Visit: Payer: Medicaid Other | Attending: Internal Medicine

## 2020-02-05 DIAGNOSIS — Z23 Encounter for immunization: Secondary | ICD-10-CM

## 2020-02-05 NOTE — Progress Notes (Signed)
   Covid-19 Vaccination Clinic  Name:  Kelli Griffin    MRN: [id number] DOB: [birth year]  02/05/2020  [name] was observed post Covid-19 immunization for 15 minutes without incident. She was provided with Vaccine Information Sheet and instruction to access the V-Safe system.   [name] was instructed to call 911 with any severe reactions post vaccine: Marland Kitchen Difficulty breathing  . Swelling of face and throat  . A fast heartbeat  . A bad rash all over body  . Dizziness and weakness   Immunizations Administered    Name Date Dose VIS Date Route   Moderna COVID-19 Vaccine 02/05/2020  1:13 PM 0.5 mL 09/2019 Intramuscular   Manufacturer: Moderna   LotMadilyn Hook   NDC: 06301-601-09

## 2020-03-04 ENCOUNTER — Ambulatory Visit: Payer: Medicaid Other | Attending: Internal Medicine

## 2020-03-04 DIAGNOSIS — Z23 Encounter for immunization: Secondary | ICD-10-CM

## 2020-03-04 NOTE — Progress Notes (Signed)
   Covid-19 Vaccination Clinic  Name:  Tanijah Morais    MRN: [id number] DOB: [birth year]  03/04/2020  Ms. Loftin was observed post Covid-19 immunization for 15 minutes without incident. She was provided with Vaccine Information Sheet and instruction to access the V-Safe system.   Ms. Spainhower was instructed to call 911 with any severe reactions post vaccine: Marland Kitchen Difficulty breathing  . Swelling of face and throat  . A fast heartbeat  . A bad rash all over body  . Dizziness and weakness   Immunizations Administered    Name Date Dose VIS Date Route   Moderna COVID-19 Vaccine 03/04/2020  1:22 PM 0.5 mL 09/2019 Intramuscular   Manufacturer: Moderna   Lot: [id number]   NDC: 03709-643-83

## 2020-06-09 ENCOUNTER — Ambulatory Visit (HOSPITAL_COMMUNITY)
Admission: EM | Admit: 2020-06-09 | Discharge: 2020-06-09 | Disposition: A | Discharge status: Home / Self Care | Payer: Medicaid Other | Attending: Emergency Medicine | Admitting: Emergency Medicine

## 2020-06-09 ENCOUNTER — Encounter (HOSPITAL_COMMUNITY): Payer: Self-pay

## 2020-06-09 ENCOUNTER — Emergency Department (HOSPITAL_COMMUNITY)
Admission: EM | Admit: 2020-06-09 | Discharge: 2020-06-09 | Disposition: A | Discharge status: Home / Self Care | Payer: Medicaid Other | Attending: Emergency Medicine | Admitting: Emergency Medicine

## 2020-06-09 ENCOUNTER — Other Ambulatory Visit: Payer: Self-pay

## 2020-06-09 DIAGNOSIS — Z5321 Procedure and treatment not carried out due to patient leaving prior to being seen by health care provider: Secondary | ICD-10-CM | POA: Diagnosis not present

## 2020-06-09 DIAGNOSIS — N898 Other specified noninflammatory disorders of vagina: Secondary | ICD-10-CM | POA: Insufficient documentation

## 2020-06-09 MED ORDER — TERCONAZOLE 0.8 % VA CREA: 1.0000 | TOPICAL_CREAM | Freq: Every day | VAGINAL | 0 refills | Status: AC

## 2020-06-09 NOTE — Discharge Instructions (Addendum)
Use the prescribed cream as directed.    Follow up with your primary care provider if your symptoms are not improving.    Your vaginal tests are pending.  If your test results are positive, we will call you.  You may require additional treatment at that time.

## 2020-06-09 NOTE — ED Triage Notes (Signed)
Pt c/o vaginal itchingx1 wk. Pt denies vaginal discharge or any other sx.

## 2020-06-09 NOTE — ED Triage Notes (Signed)
Pt. Stated, Im itching down there.

## 2020-06-09 NOTE — ED Provider Notes (Signed)
[facility]    CSN: [id number] Arrival date & time: 06/09/20  1013      History   Chief Complaint Chief Complaint  Patient presents with  . vaginal irritation    HPI Kelli Griffin is a 34 y.o. female.   Patient presents with vaginal itching and irritation x1 week.  She is sexually active but none in the past 2 to 3 weeks.  She denies vaginal discharge, pelvic pain, abdominal pain, dysuria, back pain, rash, lesions, or other symptoms.  No treatments attempted at home.  The history is provided by the patient.    Past Medical History:  Diagnosis Date  . Language barrier 05/12/2012  . No pertinent past medical history   . Pregnant     Patient Active Problem List   Diagnosis Date Noted  . Labor abnormality, delivered 02/15/2019  . Normal labor 02/12/2019  . Mastitis 10/17/2014  . Anemia--Hgb 8.1 on 09/06/14, pp 10/17/2014  . IUGR (intrauterine growth restriction) 09/01/2014  . Vaginal delivery 07/26/2012  . Midline episiotomy 07/26/2012  . Language barrier 05/12/2012    Past Surgical History:  Procedure Laterality Date  . NO PAST SURGERIES      OB History    Gravida  3   Para  3   Term  3   Preterm  0   AB  0   Living  3     SAB  0   TAB  0   Ectopic  0   Multiple  0   Live Births  3            Home Medications    Prior to Admission medications   Medication Sig Start Date End Date Taking? Authorizing Provider  ferrous sulfate 325 (65 FE) MG tablet Take 1 tablet (325 mg total) by mouth 2 (two) times daily with a meal. 02/14/19   Prothero, Henderson Newcomer, CNM  ibuprofen (ADVIL) 600 MG tablet Take 1 tablet (600 mg total) by mouth every 6 (six) hours. 02/14/19   Prothero, Henderson Newcomer, CNM  Prenatal Vit-Fe Fumarate-FA (PREPLUS) 27-1 MG TABS Take 1 tablet by mouth daily. 06/26/18   Sharyon Cable, CNM  terconazole (TERAZOL 3) 0.8 % vaginal cream Place 1 applicator vaginally at bedtime. 06/09/20   Mickie Bail, NP    Family History Family  History  Problem Relation Age of Onset  . Other Neg Hx   . Arthritis Neg Hx   . Alcohol abuse Neg Hx   . Asthma Neg Hx   . Birth defects Neg Hx   . Cancer Neg Hx   . COPD Neg Hx   . Depression Neg Hx   . Diabetes Neg Hx   . Drug abuse Neg Hx   . Early death Neg Hx   . Hearing loss Neg Hx   . Heart disease Neg Hx   . Hyperlipidemia Neg Hx   . Hypertension Neg Hx   . Kidney disease Neg Hx   . Learning disabilities Neg Hx   . Mental illness Neg Hx   . Mental retardation Neg Hx   . Miscarriages / Stillbirths Neg Hx   . Stroke Neg Hx   . Vision loss Neg Hx   . Varicose Veins Neg Hx     Social History Social History   Tobacco Use  . Smoking status: Never Smoker  . Smokeless tobacco: Never Used  Substance Use Topics  . Alcohol use: No  . Drug use: No  Allergies   Patient has no known allergies.   Review of Systems Review of Systems  Constitutional: Negative for chills and fever.  HENT: Negative for ear pain and sore throat.   Eyes: Negative for pain and visual disturbance.  Respiratory: Negative for cough and shortness of breath.   Cardiovascular: Negative for chest pain and palpitations.  Gastrointestinal: Negative for abdominal pain and vomiting.  Genitourinary: Negative for dysuria, flank pain, hematuria, pelvic pain and vaginal discharge.  Musculoskeletal: Negative for arthralgias and back pain.  Skin: Negative for color change and rash.  Neurological: Negative for seizures and syncope.  All other systems reviewed and are negative.    Physical Exam Triage Vital Signs ED Triage Vitals  Enc Vitals Group     BP 06/09/20 1057 (!) 141/89     Pulse Rate 06/09/20 1057 71     Resp 06/09/20 1057 16     Temp 06/09/20 1057 98.4 F (36.9 C)     Temp Source 06/09/20 1057 Oral     SpO2 06/09/20 1057 100 %     Weight 06/09/20 1102 140 lb (63.5 kg)     Height 06/09/20 1102 5\' 3"  (1.6 m)     Head Circumference --      Peak Flow --      Pain Score 06/09/20 1102  9     Pain Loc --      Pain Edu? --      Excl. in GC? --    No data found.  Updated Vital Signs BP (!) 141/89   Pulse 71   Temp 98.4 F (36.9 C) (Oral)   Resp 16   Ht 5\' 3"  (1.6 m)   Wt 140 lb (63.5 kg)   SpO2 100%   BMI 24.80 kg/m   Visual Acuity Right Eye Distance:   Left Eye Distance:   Bilateral Distance:    Right Eye Near:   Left Eye Near:    Bilateral Near:     Physical Exam Vitals and nursing note reviewed.  Constitutional:      General: She is not in acute distress.    Appearance: She is well-developed. She is not ill-appearing.  HENT:     Head: Normocephalic and atraumatic.     Mouth/Throat:     Mouth: Mucous membranes are moist.  Eyes:     Conjunctiva/sclera: Conjunctivae normal.  Cardiovascular:     Rate and Rhythm: Normal rate and regular rhythm.     Heart sounds: No murmur heard.   Pulmonary:     Effort: Pulmonary effort is normal. No respiratory distress.     Breath sounds: Normal breath sounds.  Abdominal:     Palpations: Abdomen is soft.     Tenderness: There is no abdominal tenderness. There is no guarding or rebound.  Musculoskeletal:     Cervical back: Neck supple.  Skin:    General: Skin is warm and dry.  Neurological:     General: No focal deficit present.     Mental Status: She is alert and oriented to person, place, and time.     Gait: Gait normal.  Psychiatric:        Mood and Affect: Mood normal.        Behavior: Behavior normal.      UC Treatments / Results  Labs (all labs ordered are listed, but only abnormal results are displayed) Labs Reviewed  CERVICOVAGINAL ANCILLARY ONLY    EKG   Radiology No results found.  Procedures Procedures (including  critical care time)  Medications Ordered in UC Medications - No data to display  Initial Impression / Assessment and Plan / UC Course  I have reviewed the triage vital signs and the nursing notes.  Pertinent labs & imaging results that were available during my  care of the patient were reviewed by me and considered in my medical decision making (see chart for details).   Vaginal itching and irritation.  Treating with terconazole vaginal cream.  Vaginal self swab obtained by patient for testing.  Low suspicion for STDs.  Discussed with patient that if her test results are positive, we will call her and she may require additional treatment at that time.  Instructed her to follow-up with her PCP if her symptoms or not improving.  Patient agrees to plan of care.   Final Clinical Impressions(s) / UC Diagnoses   Final diagnoses:  Vaginal itching  Vaginal irritation     Discharge Instructions     Use the prescribed cream as directed.    Follow up with your primary care provider if your symptoms are not improving.    Your vaginal tests are pending.  If your test results are positive, we will call you.  You may require additional treatment at that time.      ED Prescriptions    Medication Sig Dispense Auth. Provider   terconazole (TERAZOL 3) 0.8 % vaginal cream Place 1 applicator vaginally at bedtime. 20 g Mickie Bail, NP     PDMP not reviewed this encounter.   Mickie Bail, NP 06/09/20 1136

## 2020-06-11 LAB — CERVICOVAGINAL ANCILLARY ONLY
Bacterial Vaginitis (gardnerella): NEGATIVE
Candida Glabrata: POSITIVE — AB
Candida Vaginitis: POSITIVE — AB
Chlamydia: NEGATIVE
Comment: NEGATIVE
Comment: NEGATIVE
Comment: NEGATIVE
Comment: NEGATIVE
Comment: NEGATIVE
Comment: NORMAL
Neisseria Gonorrhea: NEGATIVE
Trichomonas: NEGATIVE

## 2020-06-28 ENCOUNTER — Ambulatory Visit (HOSPITAL_COMMUNITY)
Admission: EM | Admit: 2020-06-28 | Discharge: 2020-06-28 | Disposition: A | Discharge status: Home / Self Care | Payer: Medicaid Other | Attending: Family Medicine | Admitting: Family Medicine

## 2020-06-28 ENCOUNTER — Other Ambulatory Visit: Payer: Self-pay

## 2020-06-28 ENCOUNTER — Encounter (HOSPITAL_COMMUNITY): Payer: Self-pay | Admitting: Emergency Medicine

## 2020-06-28 DIAGNOSIS — R0602 Shortness of breath: Secondary | ICD-10-CM

## 2020-06-28 DIAGNOSIS — J40 Bronchitis, not specified as acute or chronic: Secondary | ICD-10-CM

## 2020-06-28 MED ORDER — PREDNISONE 10 MG (21) PO TBPK: ORAL_TABLET | Freq: Every day | ORAL | 0 refills | Status: AC

## 2020-06-28 MED ORDER — ALBUTEROL SULFATE HFA 108 (90 BASE) MCG/ACT IN AERS
INHALATION_SPRAY | RESPIRATORY_TRACT | Status: AC
  Filled 2020-06-28: qty 6.7

## 2020-06-28 MED ORDER — ALBUTEROL SULFATE HFA 108 (90 BASE) MCG/ACT IN AERS
2.0000 | INHALATION_SPRAY | Freq: Once | RESPIRATORY_TRACT | Status: AC
  Administered 2020-06-28: 2 via RESPIRATORY_TRACT

## 2020-06-28 MED ORDER — AEROCHAMBER PLUS FLO-VU MEDIUM MISC
1.0000 | Freq: Once | Status: AC
  Administered 2020-06-28: 1

## 2020-06-28 MED ORDER — AEROCHAMBER PLUS FLO-VU LARGE MISC
Status: AC
  Filled 2020-06-28: qty 1

## 2020-06-28 NOTE — ED Triage Notes (Signed)
Pt presents with SOB and chest pain xs 2 weeks.

## 2020-06-28 NOTE — Discharge Instructions (Addendum)
You have bronchitis  I have sent in a prednisone taper for you to take for 6 days. 6 tablets on day one, 5 tablets on day two, 4 tablets on day three, 3 tablets on day four, 2 tablets on day five, and 1 tablet on day six.  Use the albuterol inhaler 2 puffs every 4-6 hours as needed  Follow up with this office or with primary care if you are not improving over the next few days  Follow up with the ER for high fever, trouble swallowing, trouble breathing, other concerning symptoms

## 2020-06-28 NOTE — ED Provider Notes (Signed)
[facility]   [id number] 06/28/20 Arrival Time: 1558   SUBJECTIVE:  Kelli Griffin is a 34 y.o. female who presents with complaint of shortness of breath. Reports that it feels like she cannot get a deep enough breath. Onset abrupt approximately 2 weeks ago. Overall fatigued. SOB: mild. Wheezing: mild. Has negative history of Covid. Has completed Covid vaccines. Denies ever having this issue before. Symptoms are aggravated with activity and somewhat alleviated by rest. OTC treatment: none. Social History   Tobacco Use  Smoking Status Never Smoker  Smokeless Tobacco Never Used    ROS: As per HPI.   OBJECTIVE:  Vitals:   06/28/20 1753  BP: (!) 142/92  Pulse: 72  Resp: 18  Temp: 98.2 F (36.8 C)  TempSrc: Oral  SpO2: 100%     General appearance: alert; no distress HEENT: nasal congestion; clear runny nose; throat irritation secondary to post-nasal drainage Neck: supple without LAD Lungs: diminished lung sounds to bilateral lower lobes, mild wheezing Skin: warm and dry Psychological: alert and cooperative; normal mood and affect  Results for orders placed or performed during the hospital encounter of 06/09/20  Cervicovaginal ancillary only  Result Value Ref Range   Neisseria Gonorrhea Negative    Chlamydia Negative    Trichomonas Negative    Bacterial Vaginitis (gardnerella) Negative    Candida Vaginitis Positive (A)    Candida Glabrata Positive (A)    Comment Normal Reference Range Candida Species - Negative    Comment Normal Reference Range Candida Galbrata - Negative    Comment Normal Reference Range Trichomonas - Negative    Comment      Normal Reference Range Bacterial Vaginosis - Negative   Comment Normal Reference Ranger Chlamydia - Negative    Comment      Normal Reference Range Neisseria Gonorrhea - Negative    Labs Reviewed - No data to display  No results found.  No Known Allergies  Past Medical History:  Diagnosis Date  . Language  barrier 05/12/2012  . No pertinent past medical history   . Pregnant    Social History   Socioeconomic History  . Marital status: Married    Spouse name: ABEL -LATIF  OURO-SAMA  . Number of children: Not on file  . Years of education: 61  . Highest education level: Not on file  Occupational History  . Occupation: HOMEMAKER  Tobacco Use  . Smoking status: Never Smoker  . Smokeless tobacco: Never Used  Substance and Sexual Activity  . Alcohol use: No  . Drug use: No  . Sexual activity: Yes    Partners: Male    Birth control/protection: None  Other Topics Concern  . 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 Difficulty of Paying Living Expenses: Not on file  Food Insecurity:   . Worried About Programme researcher, broadcasting/film/video in the Last Year: Not on file  . Ran Out of Food in the Last Year: Not on file  Transportation Needs:   . Lack of Transportation (Medical): Not on file  . Lack of Transportation (Non-Medical): Not on file  Physical Activity:   . Days of Exercise per Week: Not on file  . Minutes of Exercise per Session: Not on file  Stress:   . Feeling of Stress : Not on file  Social Connections:   . Frequency of Communication with Friends and Family:  Not on file  . Frequency of Social Gatherings with Friends and Family: Not on file  . Attends Religious Services: Not on file  . Active Member of Clubs or Organizations: Not on file  . Attends Banker Meetings: Not on file  . Marital Status: Not on file  Intimate Partner Violence:   . Fear of Current or Ex-Partner: Not on file  . Emotionally Abused: Not on file  . Physically Abused: Not on file  . Sexually Abused: Not on file   Family History  Problem Relation Age of Onset  . Other Neg Hx   . Arthritis Neg Hx   . Alcohol abuse Neg Hx   . Asthma Neg Hx   . Birth defects Neg Hx   . Cancer Neg  Hx   . COPD Neg Hx   . Depression Neg Hx   . Diabetes Neg Hx   . Drug abuse Neg Hx   . Early death Neg Hx   . Hearing loss Neg Hx   . Heart disease Neg Hx   . Hyperlipidemia Neg Hx   . Hypertension Neg Hx   . Kidney disease Neg Hx   . Learning disabilities Neg Hx   . Mental illness Neg Hx   . Mental retardation Neg Hx   . Miscarriages / Stillbirths Neg Hx   . Stroke Neg Hx   . Vision loss Neg Hx   . Varicose Veins Neg Hx      ASSESSMENT & PLAN:  1. Bronchitis   2. SOB (shortness of breath)     Meds ordered this encounter  Medications  . albuterol (VENTOLIN HFA) 108 (90 Base) MCG/ACT inhaler 2 puff  . AeroChamber Plus Flo-Vu Medium MISC 1 each  . predniSONE (STERAPRED UNI-PAK 21 TAB) 10 MG (21) TBPK tablet    Sig: Take by mouth daily for 6 days. Take 6 tablets on day 1, 5 tablets on day 2, 4 tablets on day 3, 3 tablets on day 4, 2 tablets on day 5, 1 tablet on day 6    Dispense:  21 tablet    Refill:  0    Order Specific Question:   Supervising Provider    Answer:   Merrilee Jansky [id number]    Prescribed steroid taper Albuterol and spacer in the office today  OTC symptom care as needed. Will plan f/u with PCP or here as needed.  Reviewed expectations re: course of current medical issues. Questions answered. Outlined signs and symptoms indicating need for more acute intervention. Patient verbalized understanding. After Visit Summary given.           Moshe Cipro, NP 06/28/20 [phone]

## 2020-07-18 ENCOUNTER — Encounter (HOSPITAL_COMMUNITY): Payer: Self-pay | Admitting: Emergency Medicine

## 2020-07-18 ENCOUNTER — Ambulatory Visit (HOSPITAL_COMMUNITY)
Admission: EM | Admit: 2020-07-18 | Discharge: 2020-07-18 | Disposition: A | Discharge status: Home / Self Care | Payer: Medicaid Other | Attending: Family Medicine | Admitting: Family Medicine

## 2020-07-18 ENCOUNTER — Other Ambulatory Visit: Payer: Self-pay

## 2020-07-18 DIAGNOSIS — R079 Chest pain, unspecified: Secondary | ICD-10-CM

## 2020-07-18 DIAGNOSIS — M940 Chondrocostal junction syndrome [Tietze]: Secondary | ICD-10-CM

## 2020-07-18 MED ORDER — METHYLPREDNISOLONE 4 MG PO TBPK: ORAL_TABLET | ORAL | 0 refills | Status: AC

## 2020-07-18 NOTE — Discharge Instructions (Signed)
Try to rest and not do heavy lifting for a couple of days Take the Medrol Dosepak as directed Take all of day 1 today.  Take 3 pills now and then 3 pills at bedtime This is a steroid anti-inflammatory.  It is stronger than the ibuprofen When you have finished the Medrol he can go back on ibuprofen as needed I would expect you to see improvement over the next day or 2 Return if worse at any time instead of better

## 2020-07-18 NOTE — ED Triage Notes (Signed)
Pt presents with chest pain and SOB.

## 2020-07-18 NOTE — ED Provider Notes (Signed)
[facility]    CSN: [id number] Arrival date & time: 07/18/20  1756      History   Chief Complaint Chief Complaint  Patient presents with  . Chest Pain    HPI Kelli Griffin is a 34 y.o. female.   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  She states she does a lot of reaching pushing and pulling.  She states that a couple days ago she developed chest pain.  She states it hurts right in the front of her chest.  Hurts with certain arm movements.  Hurts with a deep breath.  She has no coughing, chest congestion, or signs of infection.  No fever or chills.  She has no trauma or fall.  She has no exertional chest pain or history of heart disease, hypertension. She was seen a couple weeks ago for bronchitis.  An EKG done at that time was negative.  She states that the steroids made her feel better.  Past Medical History:  Diagnosis Date  . Language barrier 05/12/2012  . No pertinent past medical history   . Pregnant     Patient Active Problem List   Diagnosis Date Noted  . Labor abnormality, delivered 02/15/2019  . Normal labor 02/12/2019  . Mastitis 10/17/2014  . Anemia--Hgb 8.1 on 09/06/14, pp 10/17/2014  . IUGR (intrauterine growth restriction) 09/01/2014  . Vaginal delivery 07/26/2012  . Midline episiotomy 07/26/2012  . Language barrier 05/12/2012    Past Surgical History:  Procedure Laterality Date  . NO PAST SURGERIES      OB History    Gravida  3   Para  3   Term  3   Preterm  0   AB  0   Living  3     SAB  0   TAB  0   Ectopic  0   Multiple  0   Live Births  3            Home Medications    Prior to Admission medications   Medication Sig Start Date End Date Taking? Authorizing Provider  ferrous sulfate 325 (65 FE) MG tablet Take 1 tablet (325 mg total) by mouth 2 (two) times daily with a meal. 02/14/19   Prothero, Henderson Newcomer, CNM  ibuprofen (ADVIL) 600 MG tablet Take 1 tablet (600 mg total) by mouth every 6  (six) hours. 02/14/19   Kenney Houseman, CNM  methylPREDNISolone (MEDROL DOSEPAK) 4 MG TBPK tablet TAD 07/18/20   Eustace Moore, MD  Prenatal Vit-Fe Fumarate-FA (PREPLUS) 27-1 MG TABS Take 1 tablet by mouth daily. 06/26/18   Sharyon Cable, CNM  terconazole (TERAZOL 3) 0.8 % vaginal cream Place 1 applicator vaginally at bedtime. 06/09/20   Mickie Bail, NP    Family History Family History  Problem Relation Age of Onset  . Other Neg Hx   . Arthritis Neg Hx   . Alcohol abuse Neg Hx   . Asthma Neg Hx   . Birth defects Neg Hx   . Cancer Neg Hx   . COPD Neg Hx   . Depression Neg Hx   . Diabetes Neg Hx   . Drug abuse Neg Hx   . Early death Neg Hx   . Hearing loss Neg Hx   . Heart disease Neg Hx   . Hyperlipidemia Neg Hx   . Hypertension Neg Hx   . Kidney disease Neg Hx   . Learning disabilities Neg Hx   .  Mental illness Neg Hx   . Mental retardation Neg Hx   . Miscarriages / Stillbirths Neg Hx   . Stroke Neg Hx   . Vision loss Neg Hx   . Varicose Veins Neg Hx     Social History Social History   Tobacco Use  . Smoking status: Never Smoker  . Smokeless tobacco: Never Used  Substance Use Topics  . Alcohol use: No  . Drug use: No     Allergies   Patient has no known allergies.   Review of Systems Review of Systems See HPI  Physical Exam Triage Vital Signs ED Triage Vitals  Enc Vitals Group     BP 07/18/20 2006 (!) 138/99     Pulse Rate 07/18/20 2006 77     Resp 07/18/20 2006 16     Temp 07/18/20 2006 98.2 F (36.8 C)     Temp Source 07/18/20 2006 Oral     SpO2 07/18/20 2006 100 %     Weight --      Height --      Head Circumference --      Peak Flow --      Pain Score 07/18/20 2005 10     Pain Loc --      Pain Edu? --      Excl. in GC? --    No data found.  Updated Vital Signs BP (!) 138/99 (BP Location: Right Arm)   Pulse 77   Temp 98.2 F (36.8 C) (Oral)   Resp 16   SpO2 100%      Physical Exam Constitutional:      General: She  is not in acute distress.    Appearance: She is well-developed and normal weight. She is not ill-appearing.  HENT:     Head: Normocephalic and atraumatic.  Eyes:     Conjunctiva/sclera: Conjunctivae normal.     Pupils: Pupils are equal, round, and reactive to light.  Cardiovascular:     Rate and Rhythm: Normal rate and regular rhythm.     Heart sounds: Normal heart sounds.  Pulmonary:     Effort: Pulmonary effort is normal. No respiratory distress.     Breath sounds: Normal breath sounds.     Comments: Heart and lung exam are normal.  Tenderness to edges of sternum.  Increased pain with pressure on ribs Chest:     Chest wall: Tenderness present.  Abdominal:     General: There is no distension.     Palpations: Abdomen is soft.     Tenderness: There is no abdominal tenderness.  Musculoskeletal:        General: Normal range of motion.     Cervical back: Normal range of motion.  Skin:    General: Skin is warm and dry.  Neurological:     Mental Status: She is alert.      UC Treatments / Results  Labs (all labs ordered are listed, but only abnormal results are displayed) Labs Reviewed - No data to display  EKG   Radiology No results found.  Procedures Procedures (including critical care time)  Medications Ordered in UC Medications - No data to display  Initial Impression / Assessment and Plan / UC Course  I have reviewed the triage vital signs and the nursing notes.  Pertinent labs & imaging results that were available during my care of the patient were reviewed by me and considered in my medical decision making (see chart for details).     Discussed  differential for chest pain.  Heart pain.  Lung pain.  Stress.  Gastrointestinal.  Chest wall.  Patient clearly has chest wall pain.  She agrees with this after examination.  Satisfied with diagnosis and treatment Final Clinical Impressions(s) / UC Diagnoses   Final diagnoses:  Chest pain, unspecified type    Costochondritis     Discharge Instructions     Try to rest and not do heavy lifting for a couple of days Take the Medrol Dosepak as directed Take all of day 1 today.  Take 3 pills now and then 3 pills at bedtime This is a steroid anti-inflammatory.  It is stronger than the ibuprofen When you have finished the Medrol he can go back on ibuprofen as needed I would expect you to see improvement over the next day or 2 Return if worse at any time instead of better   ED Prescriptions    Medication Sig Dispense Auth. Provider   methylPREDNISolone (MEDROL DOSEPAK) 4 MG TBPK tablet TAD 21 tablet Eustace Moore, MD     PDMP not reviewed this encounter.   Eustace Moore, MD 07/18/20 2103

## 2021-10-12 ENCOUNTER — Encounter (HOSPITAL_COMMUNITY): Payer: Self-pay | Admitting: Emergency Medicine

## 2021-10-12 ENCOUNTER — Ambulatory Visit (HOSPITAL_COMMUNITY)
Admission: EM | Admit: 2021-10-12 | Discharge: 2021-10-12 | Disposition: A | Discharge status: Home / Self Care | Payer: Medicaid Other | Source: Ambulatory Visit | Attending: Physician Assistant | Admitting: Physician Assistant

## 2021-10-12 ENCOUNTER — Other Ambulatory Visit: Payer: Self-pay

## 2021-10-12 DIAGNOSIS — R059 Cough, unspecified: Secondary | ICD-10-CM | POA: Insufficient documentation

## 2021-10-12 DIAGNOSIS — J069 Acute upper respiratory infection, unspecified: Secondary | ICD-10-CM | POA: Diagnosis not present

## 2021-10-12 DIAGNOSIS — J449 Chronic obstructive pulmonary disease, unspecified: Secondary | ICD-10-CM | POA: Insufficient documentation

## 2021-10-12 DIAGNOSIS — Z20822 Contact with and (suspected) exposure to covid-19: Secondary | ICD-10-CM | POA: Diagnosis not present

## 2021-10-12 DIAGNOSIS — Z87891 Personal history of nicotine dependence: Secondary | ICD-10-CM | POA: Diagnosis not present

## 2021-10-12 LAB — POC INFLUENZA A AND B ANTIGEN (URGENT CARE ONLY)
INFLUENZA A ANTIGEN, POC: NEGATIVE
INFLUENZA B ANTIGEN, POC: NEGATIVE

## 2021-10-12 MED ORDER — PROMETHAZINE-DM 6.25-15 MG/5ML PO SYRP: 5.0000 mL | ORAL_SOLUTION | Freq: Three times a day (TID) | ORAL | 0 refills | Status: AC | PRN

## 2021-10-12 NOTE — Discharge Instructions (Addendum)
You tested negative for flu.  We will contact you if you are positive for COVID.  Please stay in isolation till you receive your results.  Use Promethazine DM for cough.  This can make you sleepy so do not drive drink alcohol with taking it.  Use Mucinex, Flonase, Tylenol for additional symptom relief.  Make sure you rest drink plenty water.  If you have any worsening symptoms including chest pain, shortness of breath, high fever, nausea/vomiting interfering with oral intake, severe cough you need to be seen immediately.

## 2021-10-12 NOTE — ED Provider Notes (Signed)
[facility]    CSN: [id number] Arrival date & time: 10/12/21  1709      History   Chief Complaint Chief Complaint  Patient presents with   Cough    HPI Trenna Kiely is a 35 y.o. female.   Patient presents today with a 2-day history of URI symptoms.  Reports nasal congestion, headache, cough.  Denies any nausea, vomiting, chest pain, shortness of breath, body aches.  Does report that congestion has become heavier and she had blood tinged mucus this morning.  She denies any known sick contacts.  She has had COVID-19 vaccination but has not had influenza shot.  She has not had COVID in the past.  She denies any significant past medical history including allergies, asthma, COPD, smoking.  Denies any recent antibiotic use.  She has tried ibuprofen without improvement of symptoms.   Past Medical History:  Diagnosis Date   Language barrier 05/12/2012   No pertinent past medical history    Pregnant     Patient Active Problem List   Diagnosis Date Noted   Labor abnormality, delivered 02/15/2019   Normal labor 02/12/2019   Mastitis 10/17/2014   Anemia--Hgb 8.1 on 09/06/14, pp 10/17/2014   IUGR (intrauterine growth restriction) 09/01/2014   Vaginal delivery 07/26/2012   Midline episiotomy 07/26/2012   Language barrier 05/12/2012    Past Surgical History:  Procedure Laterality Date   NO PAST SURGERIES      OB History     Gravida  3   Para  3   Term  3   Preterm  0   AB  0   Living  3      SAB  0   IAB  0   Ectopic  0   Multiple  0   Live Births  3            Home Medications    Prior to Admission medications   Medication Sig Start Date End Date Taking? Authorizing Provider  promethazine-dextromethorphan (PROMETHAZINE-DM) 6.25-15 MG/5ML syrup Take 5 mLs by mouth 3 (three) times daily as needed for cough. 10/12/21  Yes Argusta Mcgann K, PA-C  ferrous sulfate 325 (65 FE) MG tablet Take 1 tablet (325 mg total) by mouth 2 (two) times daily  with a meal. Patient not taking: Reported on 10/12/2021 02/14/19   Prothero, Henderson Newcomer, CNM  ibuprofen (ADVIL) 600 MG tablet Take 1 tablet (600 mg total) by mouth every 6 (six) hours. 02/14/19   Prothero, Henderson Newcomer, CNM  levonorgestrel (MIRENA, 52 MG,) 20 MCG/DAY IUD Mirena 20 mcg/24 hours (8 yrs) 52 mg intrauterine device  Take 1 device by intrauterine route.    [provider]  methylPREDNISolone (MEDROL DOSEPAK) 4 MG TBPK tablet TAD Patient not taking: Reported on 10/12/2021 07/18/20   Eustace Moore, MD  Prenatal Vit-Fe Fumarate-FA (PREPLUS) 27-1 MG TABS Take 1 tablet by mouth daily. 06/26/18   Sharyon Cable, CNM  terconazole (TERAZOL 3) 0.8 % vaginal cream Place 1 applicator vaginally at bedtime. 06/09/20   Mickie Bail, NP    Family History Family History  Problem Relation Age of Onset   Healthy Mother    Other Neg Hx    Arthritis Neg Hx    Alcohol abuse Neg Hx    Asthma Neg Hx    Birth defects Neg Hx    Cancer Neg Hx    COPD Neg Hx    Depression Neg Hx    Diabetes Neg Hx  Drug abuse Neg Hx    Early death Neg Hx    Hearing loss Neg Hx    Heart disease Neg Hx    Hyperlipidemia Neg Hx    Hypertension Neg Hx    Kidney disease Neg Hx    Learning disabilities Neg Hx    Mental illness Neg Hx    Mental retardation Neg Hx    Miscarriages / Stillbirths Neg Hx    Stroke Neg Hx    Vision loss Neg Hx    Varicose Veins Neg Hx     Social History Social History   Tobacco Use   Smoking status: Never   Smokeless tobacco: Never  Vaping Use   Vaping Use: Never used  Substance Use Topics   Alcohol use: No   Drug use: No     Allergies   Patient has no known allergies.   Review of Systems Review of Systems  Constitutional:  Positive for activity change and fatigue. Negative for appetite change and fever.  HENT:  Positive for congestion, postnasal drip and sore throat. Negative for ear pain, sinus pressure and sneezing.   Respiratory:  Positive for cough.  Negative for shortness of breath.   Cardiovascular:  Negative for chest pain.  Gastrointestinal:  Negative for abdominal pain, diarrhea, nausea and vomiting.  Musculoskeletal:  Negative for arthralgias and myalgias.  Neurological:  Positive for headaches. Negative for dizziness and light-headedness.    Physical Exam Triage Vital Signs ED Triage Vitals  Enc Vitals Group     BP 10/12/21 1723 120/78     Pulse Rate 10/12/21 1723 88     Resp 10/12/21 1723 20     Temp 10/12/21 1723 97.9 F (36.6 C)     Temp Source 10/12/21 1723 Oral     SpO2 10/12/21 1723 98 %     Weight --      Height --      Head Circumference --      Peak Flow --      Pain Score 10/12/21 1719 7     Pain Loc --      Pain Edu? --      Excl. in GC? --    No data found.  Updated Vital Signs BP 120/78 (BP Location: Left Arm)    Pulse 88    Temp 97.9 F (36.6 C) (Oral)    Resp 20    SpO2 98%   Visual Acuity Right Eye Distance:   Left Eye Distance:   Bilateral Distance:    Right Eye Near:   Left Eye Near:    Bilateral Near:     Physical Exam Vitals reviewed.  Constitutional:      General: She is awake. She is not in acute distress.    Appearance: Normal appearance. She is well-developed. She is not ill-appearing.     Comments: Very pleasant female appears stated age in no acute distress sitting comfortably in exam room  HENT:     Head: Normocephalic and atraumatic.     Right Ear: Tympanic membrane, ear canal and external ear normal. Tympanic membrane is not erythematous or bulging.     Left Ear: Tympanic membrane, ear canal and external ear normal. Tympanic membrane is not erythematous or bulging.     Nose:     Right Sinus: Maxillary sinus tenderness present. No frontal sinus tenderness.     Left Sinus: Maxillary sinus tenderness present. No frontal sinus tenderness.     Mouth/Throat:     Pharynx:  Uvula midline. Posterior oropharyngeal erythema present. No oropharyngeal exudate.  Cardiovascular:      Rate and Rhythm: Normal rate and regular rhythm.     Heart sounds: Normal heart sounds, S1 normal and S2 normal. No murmur heard. Pulmonary:     Effort: Pulmonary effort is normal.     Breath sounds: Normal breath sounds. No wheezing, rhonchi or rales.     Comments: Clear to auscultation bilaterally Psychiatric:        Behavior: Behavior is cooperative.     UC Treatments / Results  Labs (all labs ordered are listed, but only abnormal results are displayed) Labs Reviewed  SARS CORONAVIRUS 2 (TAT 6-24 HRS)  POC INFLUENZA A AND B ANTIGEN (URGENT CARE ONLY)    EKG   Radiology No results found.  Procedures Procedures (including critical care time)  Medications Ordered in UC Medications - No data to display  Initial Impression / Assessment and Plan / UC Course  I have reviewed the triage vital signs and the nursing notes.  Pertinent labs & imaging results that were available during my care of the patient were reviewed by me and considered in my medical decision making (see chart for details).     Discussed likely viral etiology of symptoms.  No evidence of acute infection that warrant initiation of antibiotics today.  Flu testing was negative.  COVID test is pending.  She was encouraged to use over-the-counter medication including Mucinex, Flonase, Tylenol for symptom relief.  She is to rest and drink plenty of fluid.  She was given Promethazine DM for cough with instruction not to drive or drink alcohol with taking this medication as drowsiness is a common side effect; she is not pregnant and not currently breast-feeding.  Discussed alarm symptoms that warrant emergent evaluation.  Strict return precautions given to which she expressed understanding.  Work excuse note with current CDC return to work guidelines based on COVID test results provided today.  Final Clinical Impressions(s) / UC Diagnoses   Final diagnoses:  Viral URI with cough     Discharge Instructions       You tested negative for flu.  We will contact you if you are positive for COVID.  Please stay in isolation till you receive your results.  Use Promethazine DM for cough.  This can make you sleepy so do not drive drink alcohol with taking it.  Use Mucinex, Flonase, Tylenol for additional symptom relief.  Make sure you rest drink plenty water.  If you have any worsening symptoms including chest pain, shortness of breath, high fever, nausea/vomiting interfering with oral intake, severe cough you need to be seen immediately.     ED Prescriptions     Medication Sig Dispense Auth. Provider   promethazine-dextromethorphan (PROMETHAZINE-DM) 6.25-15 MG/5ML syrup Take 5 mLs by mouth 3 (three) times daily as needed for cough. 118 mL Emberlyn Burlison K, PA-C      PDMP not reviewed this encounter.   Jeani Hawking, PA-C 10/12/21 1815

## 2021-10-12 NOTE — ED Triage Notes (Signed)
Cough for 2 days, onset 10/10/2021.  Patient has a productive cough, blood tinged.  Patient has a stuffy, runny nose, and sore throat

## 2021-10-13 LAB — SARS CORONAVIRUS 2 (TAT 6-24 HRS): SARS Coronavirus 2: NEGATIVE

## 2021-11-28 ENCOUNTER — Other Ambulatory Visit: Payer: Self-pay | Admitting: Family Medicine

## 2021-11-28 ENCOUNTER — Other Ambulatory Visit (HOSPITAL_COMMUNITY)
Admission: RE | Admit: 2021-11-28 | Discharge: 2021-11-28 | Disposition: A | Discharge status: Home / Self Care | Payer: Medicaid Other | Source: Ambulatory Visit | Attending: Family Medicine | Admitting: Family Medicine

## 2021-11-28 DIAGNOSIS — N898 Other specified noninflammatory disorders of vagina: Secondary | ICD-10-CM | POA: Diagnosis present

## 2021-11-28 DIAGNOSIS — Z124 Encounter for screening for malignant neoplasm of cervix: Secondary | ICD-10-CM | POA: Insufficient documentation

## 2021-12-02 LAB — MOLECULAR ANCILLARY ONLY
Bacterial Vaginitis (gardnerella): NEGATIVE
Candida Glabrata: POSITIVE — AB
Candida Vaginitis: POSITIVE — AB
Chlamydia: NEGATIVE
Comment: NEGATIVE
Comment: NEGATIVE
Comment: NEGATIVE
Comment: NEGATIVE
Comment: NEGATIVE
Comment: NORMAL
Neisseria Gonorrhea: NEGATIVE
Trichomonas: NEGATIVE

## 2021-12-03 LAB — CYTOLOGY - PAP
Comment: NEGATIVE
Comment: NEGATIVE
Diagnosis: NEGATIVE
HSV1: NEGATIVE
HSV2: NEGATIVE
High risk HPV: NEGATIVE

## 2022-06-05 ENCOUNTER — Encounter (HOSPITAL_COMMUNITY): Payer: Self-pay

## 2022-06-05 ENCOUNTER — Ambulatory Visit (HOSPITAL_COMMUNITY)
Admission: EM | Admit: 2022-06-05 | Discharge: 2022-06-05 | Disposition: A | Discharge status: Home / Self Care | Payer: Medicaid Other | Attending: Internal Medicine | Admitting: Internal Medicine

## 2022-06-05 DIAGNOSIS — R6889 Other general symptoms and signs: Secondary | ICD-10-CM

## 2022-06-05 DIAGNOSIS — J309 Allergic rhinitis, unspecified: Secondary | ICD-10-CM | POA: Diagnosis not present

## 2022-06-05 MED ORDER — CETIRIZINE HCL 10 MG PO TABS: 10.0000 mg | ORAL_TABLET | Freq: Every day | ORAL | 2 refills | Status: AC

## 2022-06-05 MED ORDER — FLUTICASONE PROPIONATE 50 MCG/ACT NA SUSP: 1.0000 | Freq: Every day | NASAL | 2 refills | Status: AC

## 2022-06-05 MED ORDER — BENZONATATE 100 MG PO CAPS: 100.0000 mg | ORAL_CAPSULE | Freq: Three times a day (TID) | ORAL | 0 refills | Status: AC

## 2022-06-05 MED ORDER — OLOPATADINE HCL 0.1 % OP SOLN: 1.0000 [drp] | Freq: Two times a day (BID) | OPHTHALMIC | 12 refills | Status: AC

## 2022-06-05 NOTE — ED Triage Notes (Signed)
Pt has a cough and nasal congestion [id number]. Pt denies fever

## 2022-06-05 NOTE — ED Provider Notes (Signed)
Kelli Griffin    CSN: [id number] Arrival date & time: 06/05/22  1925      History   Chief Complaint Chief Complaint  Patient presents with   Cough    HPI Kelli Griffin is a 36 y.o. female.   Patient presents urgent care for evaluation of cough that she has had for the last 2 weeks.  Cough is dry and she reports associated symptoms of runny nose and watery itchy eyes.  Denies headache, sore throat, ear pain, fever/chills, body aches, dizziness, urinary symptoms, abdominal pain, nausea, vomiting, and lightheadedness.  No known sick contacts at home.  No recent travel.  Patient states that she intermittently coughs up blood-tinged sputum but states sputum is mostly clear if present. Nasal mucous is clear as well. Denies difficulty breathing through nose and history of allergies/asthma. She has not recently inhaled new allergens/irritants to her knowledge and denies changes to her environment. She has not attempted use of any over the counter medications prior to arrival at urgent care. She is not a smoker and denies drug use.    Cough   Past Medical History:  Diagnosis Date   Language barrier 05/12/2012   No pertinent past medical history    Pregnant     Patient Active Problem List   Diagnosis Date Noted   Labor abnormality, delivered 02/15/2019   Normal labor 02/12/2019   Mastitis 10/17/2014   Anemia--Hgb 8.1 on 09/06/14, pp 10/17/2014   IUGR (intrauterine growth restriction) 09/01/2014   Vaginal delivery 07/26/2012   Midline episiotomy 07/26/2012   Language barrier 05/12/2012    Past Surgical History:  Procedure Laterality Date   NO PAST SURGERIES      OB History     Gravida  3   Para  3   Term  3   Preterm  0   AB  0   Living  3      SAB  0   IAB  0   Ectopic  0   Multiple  0   Live Births  3            Home Medications    Prior to Admission medications   Medication Sig Start Date End Date Taking? Authorizing Provider   benzonatate (TESSALON) 100 MG capsule Take 1 capsule (100 mg total) by mouth every 8 (eight) hours. 06/05/22  Yes Talbot Grumbling, FNP  cetirizine (ZYRTEC) 10 MG tablet Take 1 tablet (10 mg total) by mouth daily. 06/05/22  Yes Talbot Grumbling, FNP  fluticasone (FLONASE) 50 MCG/ACT nasal spray Place 1 spray into both nostrils daily. 06/05/22  Yes Talbot Grumbling, FNP  olopatadine (PATANOL) 0.1 % ophthalmic solution Place 1 drop into both eyes 2 (two) times daily. 06/05/22  Yes Talbot Grumbling, FNP  ferrous sulfate 325 (65 FE) MG tablet Take 1 tablet (325 mg total) by mouth 2 (two) times daily with a meal. Patient not taking: Reported on 10/12/2021 02/14/19   Prothero, Pleas Koch, CNM  ibuprofen (ADVIL) 600 MG tablet Take 1 tablet (600 mg total) by mouth every 6 (six) hours. 02/14/19   Prothero, Pleas Koch, CNM  levonorgestrel (MIRENA, 52 MG,) 20 MCG/DAY IUD Mirena 20 mcg/24 hours (8 yrs) 52 mg intrauterine device  Take 1 device by intrauterine route.    [provider]  methylPREDNISolone (MEDROL DOSEPAK) 4 MG TBPK tablet TAD Patient not taking: Reported on 10/12/2021 07/18/20   Raylene Everts, MD  Prenatal Vit-Fe Fumarate-FA (PREPLUS) 27-1 MG TABS  Take 1 tablet by mouth daily. 06/26/18   Lajean Manes, CNM  promethazine-dextromethorphan (PROMETHAZINE-DM) 6.25-15 MG/5ML syrup Take 5 mLs by mouth 3 (three) times daily as needed for cough. 10/12/21   Raspet, Derry Skill, PA-C  terconazole (TERAZOL 3) 0.8 % vaginal cream Place 1 applicator vaginally at bedtime. 06/09/20   Sharion Balloon, NP    Family History Family History  Problem Relation Age of Onset   Healthy Mother    Other Neg Hx    Arthritis Neg Hx    Alcohol abuse Neg Hx    Asthma Neg Hx    Birth defects Neg Hx    Cancer Neg Hx    COPD Neg Hx    Depression Neg Hx    Diabetes Neg Hx    Drug abuse Neg Hx    Early death Neg Hx    Hearing loss Neg Hx    Heart disease Neg Hx    Hyperlipidemia Neg Hx     Hypertension Neg Hx    Kidney disease Neg Hx    Learning disabilities Neg Hx    Mental illness Neg Hx    Mental retardation Neg Hx    Miscarriages / Stillbirths Neg Hx    Stroke Neg Hx    Vision loss Neg Hx    Varicose Veins Neg Hx     Social History Social History   Tobacco Use   Smoking status: Never   Smokeless tobacco: Never  Vaping Use   Vaping Use: Never used  Substance Use Topics   Alcohol use: No   Drug use: No     Allergies   Patient has no known allergies.   Review of Systems Review of Systems  Respiratory:  Positive for cough.   Per HPI   Physical Exam Triage Vital Signs ED Triage Vitals  Enc Vitals Group     BP 06/05/22 2025 (!) 143/100     Pulse Rate 06/05/22 2025 75     Resp 06/05/22 2025 12     Temp 06/05/22 2025 99 F (37.2 C)     Temp Source 06/05/22 2025 Oral     SpO2 06/05/22 2025 99 %     Weight 06/05/22 2023 156 lb (70.8 kg)     Height 06/05/22 2023 5\' 4"  (1.626 m)     Head Circumference --      Peak Flow --      Pain Score 06/05/22 2022 0     Pain Loc --      Pain Edu? --      Excl. in [place]? --    No data found.  Updated Vital Signs BP (!) 143/100 (BP Location: Left Arm)   Pulse 75   Temp 99 F (37.2 C) (Oral)   Resp 12   Ht 5\' 4"  (1.626 m)   Wt 156 lb (70.8 kg)   LMP  (LMP Unknown)   SpO2 99%   BMI 26.78 kg/m   Visual Acuity Right Eye Distance:   Left Eye Distance:   Bilateral Distance:    Right Eye Near:   Left Eye Near:    Bilateral Near:     Physical Exam Vitals and nursing note reviewed.  Constitutional:      Appearance: Normal appearance. She is not ill-appearing or toxic-appearing.     Comments: Very pleasant patient sitting on exam in position of comfort table in no acute distress.   HENT:     Head: Normocephalic and atraumatic.  Right Ear: Hearing, tympanic membrane, ear canal and external ear normal.     Left Ear: Hearing, tympanic membrane, ear canal and external ear normal.     Nose: Rhinorrhea  present. Rhinorrhea is clear.     Right Turbinates: Swollen and pale.     Left Turbinates: Swollen and pale.     Mouth/Throat:     Lips: Pink.     Mouth: Mucous membranes are moist.     Pharynx: Uvula midline. Posterior oropharyngeal erythema present. No pharyngeal swelling.     Comments: Mild erythema to posterior oropharynx with small amount of clear postnasal drainage visualized. Airway intact and patent. Eyes:     General: Lids are normal. Vision grossly intact. Gaze aligned appropriately.     Extraocular Movements: Extraocular movements intact.     Conjunctiva/sclera: Conjunctivae normal.     Pupils: Pupils are equal, round, and reactive to light.  Cardiovascular:     Rate and Rhythm: Normal rate and regular rhythm.     Heart sounds: Normal heart sounds, S1 normal and S2 normal.  Pulmonary:     Effort: Pulmonary effort is normal. No respiratory distress.     Breath sounds: Normal breath sounds and air entry.     Comments: Clear to auscultation bilaterally. No respiratory distress. Abdominal:     General: Bowel sounds are normal.     Palpations: Abdomen is soft.  Musculoskeletal:     Cervical back: Neck supple.  Lymphadenopathy:     Cervical: No cervical adenopathy.  Skin:    General: Skin is warm and dry.     Capillary Refill: Capillary refill takes less than 2 seconds.     Findings: No rash.  Neurological:     General: No focal deficit present.     Mental Status: She is alert and oriented to person, place, and time. Mental status is at baseline.     Cranial Nerves: No dysarthria or facial asymmetry.     Motor: No weakness.     Gait: Gait is intact. Gait normal.  Psychiatric:        Mood and Affect: Mood normal.        Speech: Speech normal.        Behavior: Behavior normal.        Thought Content: Thought content normal.        Judgment: Judgment normal.      UC Treatments / Results  Labs (all labs ordered are listed, but only abnormal results are  displayed) Labs Reviewed - No data to display  EKG   Radiology No results found.  Procedures Procedures (including critical care time)  Medications Ordered in UC Medications - No data to display  Initial Impression / Assessment and Plan / UC Course  I have reviewed the triage vital signs and the nursing notes.  Pertinent labs & imaging results that were available during my care of the patient were reviewed by me and considered in my medical decision making (see chart for details).   1. Allergic rhinitis Symptoms and physical exam consistent with allergic rhinitis etiology. Doubt acute viral URI cause of symptoms. No indication for imaging at this time based on stable cardiopulmonary exam and hemodynamically stable vital signs. Advised to avoid known allergens and begin using daily antihistamine cetirizine 10mg  to dry up post-nasal drainage that is likely causing cough. Tessalon pearles may be used as needed for cough every 8 hours. Flonase daily for nasal inflammation and rhinorrhea. Olopatadine eye drops for symptomatic relief related  to suspected allergic conjunctivitis/watery itchy eyes. Patient agreeable with plan.  Strict ED/urgent care return precautions given.  Patient verbalizes understanding and agreement with plan.  Counseled patient regarding possible side effects and uses of all medications prescribed at today's visit.  Patient verbalizes understanding and agreement with plan.  All questions answered.  Patient discharged from urgent care in stable condition.   Final Clinical Impressions(s) / UC Diagnoses   Final diagnoses:  Allergic rhinitis, unspecified seasonality, unspecified trigger  Itchy eyes     Discharge Instructions      Take cetirizine once daily to dry up your nasal mucus.  This is likely the cause of your cough.  Take Tessalon Perles every 8 hours as needed for cough.  Use 1 spray of Flonase to into each nostril once daily in the morning to reduce mucus  and inflammation in your nose.  Use olopatadine eyedrops 1 drop into both eyes 2 times daily as needed for watery itchy eyes.  Return to urgent care as needed.  If you develop any new or worsening symptoms that are severe, go to the emergency room.  I hope you feel better!     ED Prescriptions     Medication Sig Dispense Auth. Provider   cetirizine (ZYRTEC) 10 MG tablet Take 1 tablet (10 mg total) by mouth daily. 30 tablet Reita May M, FNP   benzonatate (TESSALON) 100 MG capsule Take 1 capsule (100 mg total) by mouth every 8 (eight) hours. 21 capsule Reita May M, FNP   olopatadine (PATANOL) 0.1 % ophthalmic solution Place 1 drop into both eyes 2 (two) times daily. 5 mL Reita May M, FNP   fluticasone [facility]) 50 MCG/ACT nasal spray Place 1 spray into both nostrils daily. 16 g Carlisle Beers, FNP      PDMP not reviewed this encounter.   Carlisle Beers, [place] 06/07/22 1217

## 2022-06-05 NOTE — Discharge Instructions (Addendum)
Take cetirizine once daily to dry up your nasal mucus.  This is likely the cause of your cough.  Take Tessalon Perles every 8 hours as needed for cough.  Use 1 spray of Flonase to into each nostril once daily in the morning to reduce mucus and inflammation in your nose.  Use olopatadine eyedrops 1 drop into both eyes 2 times daily as needed for watery itchy eyes.  Return to urgent care as needed.  If you develop any new or worsening symptoms that are severe, go to the emergency room.  I hope you feel better!

## 2023-06-05 ENCOUNTER — Other Ambulatory Visit: Payer: Self-pay | Admitting: Family Medicine

## 2023-06-05 ENCOUNTER — Ambulatory Visit
Admission: RE | Admit: 2023-06-05 | Discharge: 2023-06-05 | Disposition: A | Discharge status: Home / Self Care | Payer: Medicaid Other | Source: Ambulatory Visit | Attending: Family Medicine | Admitting: Family Medicine

## 2023-06-05 DIAGNOSIS — M7501 Adhesive capsulitis of right shoulder: Secondary | ICD-10-CM
